# Patient Record
Sex: Female | Born: 1960 | Race: Black or African American | Hispanic: No | Marital: Married | Smoking: Former smoker
Health system: Southern US, Community
[De-identification: ages and names within clinical notes are randomized; demographics above are authoritative.]

## PROBLEM LIST (undated history)

## (undated) DIAGNOSIS — I10 Essential (primary) hypertension: Secondary | ICD-10-CM

## (undated) DIAGNOSIS — E162 Hypoglycemia, unspecified: Secondary | ICD-10-CM

## (undated) DIAGNOSIS — K219 Gastro-esophageal reflux disease without esophagitis: Secondary | ICD-10-CM

## (undated) DIAGNOSIS — E119 Type 2 diabetes mellitus without complications: Secondary | ICD-10-CM

## (undated) DIAGNOSIS — R42 Dizziness and giddiness: Secondary | ICD-10-CM

## (undated) DIAGNOSIS — F329 Major depressive disorder, single episode, unspecified: Secondary | ICD-10-CM

## (undated) DIAGNOSIS — M1990 Unspecified osteoarthritis, unspecified site: Secondary | ICD-10-CM

## (undated) DIAGNOSIS — F32A Depression, unspecified: Secondary | ICD-10-CM

## (undated) HISTORY — PX: OTHER SURGICAL HISTORY: SHX169

## (undated) HISTORY — DX: Major depressive disorder, single episode, unspecified: F32.9

## (undated) HISTORY — DX: Type 2 diabetes mellitus without complications: E11.9

## (undated) HISTORY — DX: Essential (primary) hypertension: I10

## (undated) HISTORY — PX: PARTIAL HYSTERECTOMY: SHX80

## (undated) HISTORY — DX: Dizziness and giddiness: R42

## (undated) HISTORY — DX: Depression, unspecified: F32.A

---

## 2000-08-02 ENCOUNTER — Encounter: Admission: RE | Admit: 2000-08-02 | Discharge: 2000-08-02 | Payer: Self-pay | Admitting: *Deleted

## 2000-08-02 ENCOUNTER — Encounter: Payer: Self-pay | Admitting: *Deleted

## 2009-03-19 ENCOUNTER — Ambulatory Visit: Payer: Self-pay | Admitting: Oncology

## 2009-03-26 LAB — TECHNOLOGIST REVIEW: Technologist Review: 2

## 2009-03-26 LAB — CBC WITH DIFFERENTIAL/PLATELET
BASO%: 0.5 % (ref 0.0–2.0)
Basophils Absolute: 0.1 10*3/uL (ref 0.0–0.1)
EOS%: 1.4 % (ref 0.0–7.0)
Eosinophils Absolute: 0.2 10*3/uL (ref 0.0–0.5)
HCT: 42.3 % (ref 34.8–46.6)
HGB: 14.1 g/dL (ref 11.6–15.9)
LYMPH%: 30.7 % (ref 14.0–49.7)
MCH: 29.7 pg (ref 25.1–34.0)
MCHC: 33.4 g/dL (ref 31.5–36.0)
MCV: 89.1 fL (ref 79.5–101.0)
MONO#: 0.7 10*3/uL (ref 0.1–0.9)
MONO%: 5.2 % (ref 0.0–14.0)
NEUT#: 8 10*3/uL — ABNORMAL HIGH (ref 1.5–6.5)
NEUT%: 62.2 % (ref 38.4–76.8)
Platelets: 297 10*3/uL (ref 145–400)
RBC: 4.75 10*6/uL (ref 3.70–5.45)
RDW: 13.4 % (ref 11.2–14.5)
WBC: 12.9 10*3/uL — ABNORMAL HIGH (ref 3.9–10.3)
lymph#: 4 10*3/uL — ABNORMAL HIGH (ref 0.9–3.3)

## 2009-03-26 LAB — COMPREHENSIVE METABOLIC PANEL
ALT: 22 U/L (ref 0–35)
Albumin: 4.1 g/dL (ref 3.5–5.2)
BUN: 16 mg/dL (ref 6–23)
CO2: 25 mEq/L (ref 19–32)
Calcium: 9.5 mg/dL (ref 8.4–10.5)
Creatinine, Ser: 0.82 mg/dL (ref 0.40–1.20)
Glucose, Bld: 146 mg/dL — ABNORMAL HIGH (ref 70–99)
Sodium: 138 mEq/L (ref 135–145)
Total Bilirubin: 0.2 mg/dL — ABNORMAL LOW (ref 0.3–1.2)

## 2009-06-20 ENCOUNTER — Ambulatory Visit: Payer: Self-pay | Admitting: Oncology

## 2009-06-27 LAB — CBC WITH DIFFERENTIAL/PLATELET
BASO%: 0.3 % (ref 0.0–2.0)
Basophils Absolute: 0 10*3/uL (ref 0.0–0.1)
EOS%: 1.3 % (ref 0.0–7.0)
Eosinophils Absolute: 0.2 10*3/uL (ref 0.0–0.5)
HCT: 35.8 % (ref 34.8–46.6)
HGB: 12.3 g/dL (ref 11.6–15.9)
LYMPH%: 28.5 % (ref 14.0–49.7)
MCH: 30.3 pg (ref 25.1–34.0)
MCHC: 34.3 g/dL (ref 31.5–36.0)
MCV: 88.5 fL (ref 79.5–101.0)
MONO#: 0.8 10*3/uL (ref 0.1–0.9)
MONO%: 6 % (ref 0.0–14.0)
NEUT#: 8.2 10*3/uL — ABNORMAL HIGH (ref 1.5–6.5)
NEUT%: 63.9 % (ref 38.4–76.8)
Platelets: 270 10*3/uL (ref 145–400)
RBC: 4.05 10*6/uL (ref 3.70–5.45)
RDW: 12.9 % (ref 11.2–14.5)
WBC: 12.8 10*3/uL — ABNORMAL HIGH (ref 3.9–10.3)
lymph#: 3.7 10*3/uL — ABNORMAL HIGH (ref 0.9–3.3)

## 2009-08-20 ENCOUNTER — Emergency Department: Payer: Self-pay | Admitting: Emergency Medicine

## 2009-11-17 ENCOUNTER — Encounter: Admission: RE | Admit: 2009-11-17 | Discharge: 2009-11-17 | Payer: Self-pay | Admitting: Family Medicine

## 2010-01-02 ENCOUNTER — Ambulatory Visit: Payer: Self-pay | Admitting: Oncology

## 2012-06-09 ENCOUNTER — Other Ambulatory Visit: Payer: Self-pay | Admitting: Family Medicine

## 2012-06-09 DIAGNOSIS — Z1231 Encounter for screening mammogram for malignant neoplasm of breast: Secondary | ICD-10-CM

## 2012-07-17 ENCOUNTER — Ambulatory Visit
Admission: RE | Admit: 2012-07-17 | Discharge: 2012-07-17 | Disposition: A | Discharge status: Home / Self Care | Payer: 59 | Source: Ambulatory Visit | Attending: Family Medicine | Admitting: Family Medicine

## 2012-07-17 DIAGNOSIS — Z1231 Encounter for screening mammogram for malignant neoplasm of breast: Secondary | ICD-10-CM

## 2012-08-21 ENCOUNTER — Ambulatory Visit: Payer: Self-pay | Admitting: Specialist

## 2012-09-13 ENCOUNTER — Ambulatory Visit: Payer: Self-pay | Admitting: Specialist

## 2012-10-10 ENCOUNTER — Ambulatory Visit: Payer: Self-pay | Admitting: Specialist

## 2013-01-02 ENCOUNTER — Ambulatory Visit: Payer: Self-pay | Admitting: Specialist

## 2013-01-09 ENCOUNTER — Inpatient Hospital Stay: Payer: Self-pay | Admitting: Specialist

## 2013-01-09 HISTORY — PX: LAPAROSCOPIC GASTRIC SLEEVE RESECTION: SHX5895

## 2013-01-10 LAB — BASIC METABOLIC PANEL
Calcium, Total: 8.8 mg/dL (ref 8.5–10.1)
Chloride: 107 mmol/L (ref 98–107)
Co2: 22 mmol/L (ref 21–32)
EGFR (African American): >60
EGFR (Non-African Amer.): >60
Osmolality: 270 (ref 275–301)
Potassium: 3.8 mmol/L (ref 3.5–5.1)
Sodium: 136 mmol/L (ref 136–145)

## 2013-01-10 LAB — CBC WITH DIFFERENTIAL/PLATELET
Basophil #: 0 10*3/uL (ref 0.0–0.1)
Basophil %: 0.3 %
Eosinophil #: 0 10*3/uL (ref 0.0–0.7)
Eosinophil %: 0 %
HGB: 11.9 g/dL — ABNORMAL LOW (ref 12.0–16.0)
Lymphocyte #: 2.3 10*3/uL (ref 1.0–3.6)
MCH: 28.5 pg (ref 26.0–34.0)
MCV: 86 fL (ref 80–100)
Neutrophil #: 11.2 10*3/uL — ABNORMAL HIGH (ref 1.4–6.5)
Neutrophil %: 77.1 %
Platelet: 295 10*3/uL (ref 150–440)
RDW: 13.6 % (ref 11.5–14.5)
WBC: 14.6 10*3/uL — ABNORMAL HIGH (ref 3.6–11.0)

## 2013-01-10 LAB — MAGNESIUM: Magnesium: 1.4 mg/dL — ABNORMAL LOW

## 2013-01-10 LAB — PHOSPHORUS: Phosphorus: 3.2 mg/dL (ref 2.5–4.9)

## 2013-01-10 LAB — ALBUMIN: Albumin: 3.4 g/dL (ref 3.4–5.0)

## 2013-02-01 ENCOUNTER — Ambulatory Visit: Payer: Self-pay | Admitting: Specialist

## 2013-02-10 ENCOUNTER — Ambulatory Visit: Payer: Self-pay | Admitting: Specialist

## 2013-07-16 IMAGING — US ABDOMEN ULTRASOUND LIMITED
1 series · 14 of 25 positions shown · non-contrast
Comparison: none

REASON FOR EXAM: high blood pressure diabetes snoring morbid obesity
COMMENTS:

[Series 1: abdomen ultrasound limited · 0.26mm/px · 14 of 52 slices shown]
[im 1/52]
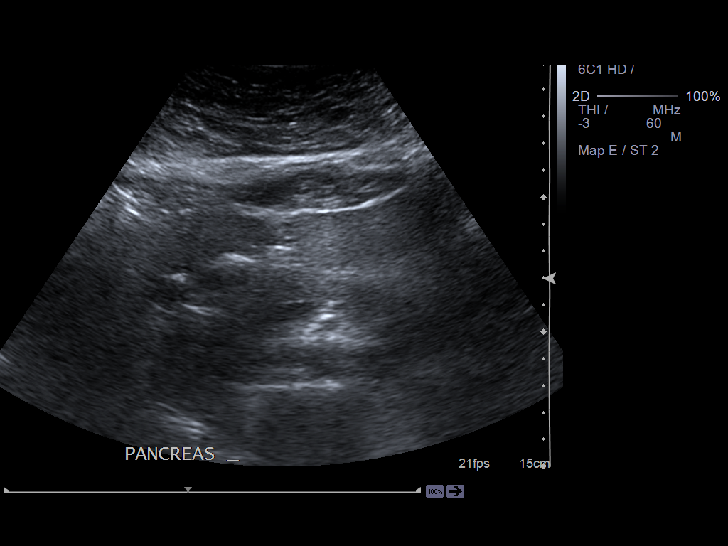
[im 5/52]
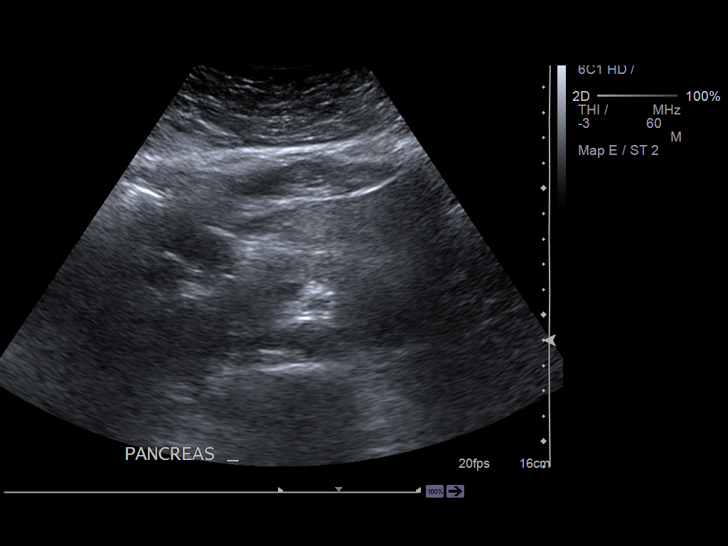
[im 9/52]
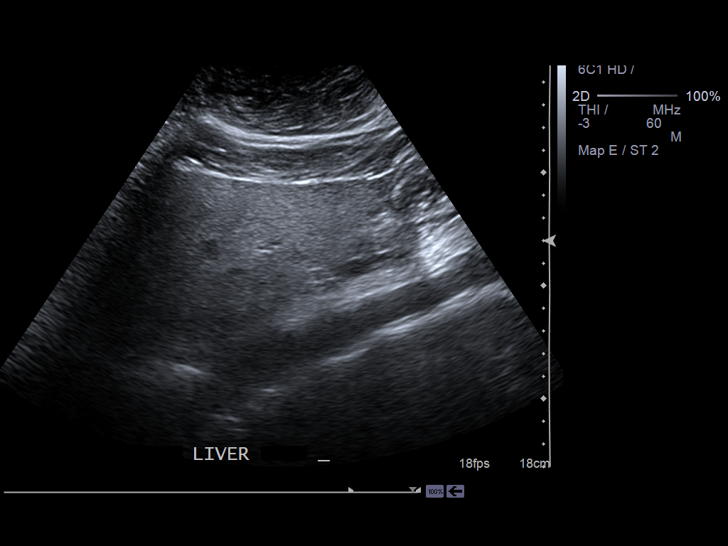
[im 13/52]
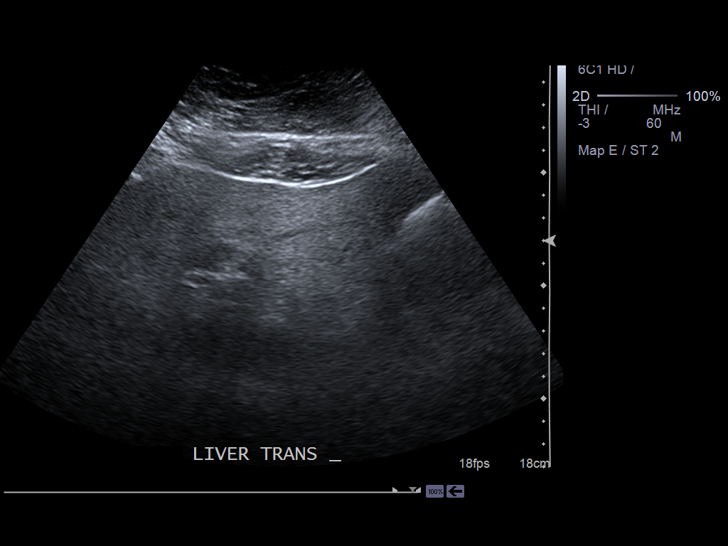
[im 18/52]
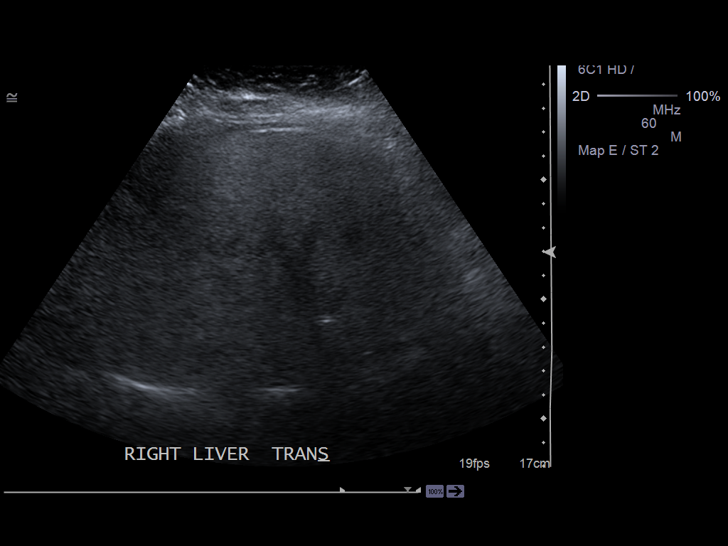
[im 20/52]
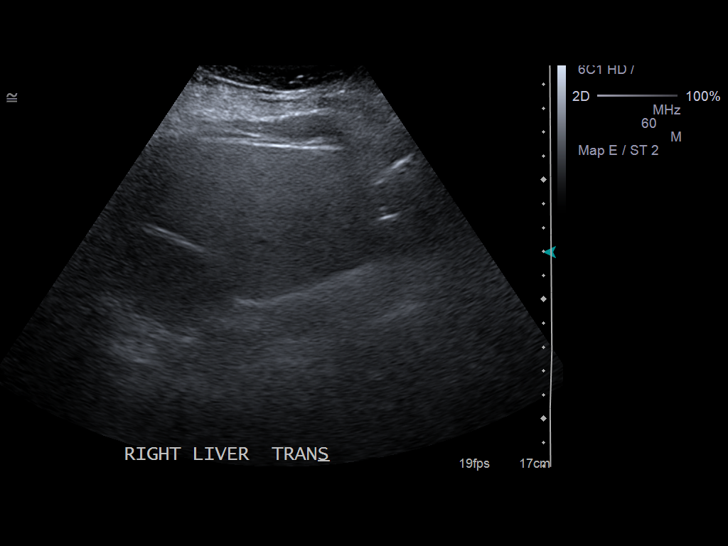
[im 24/52]
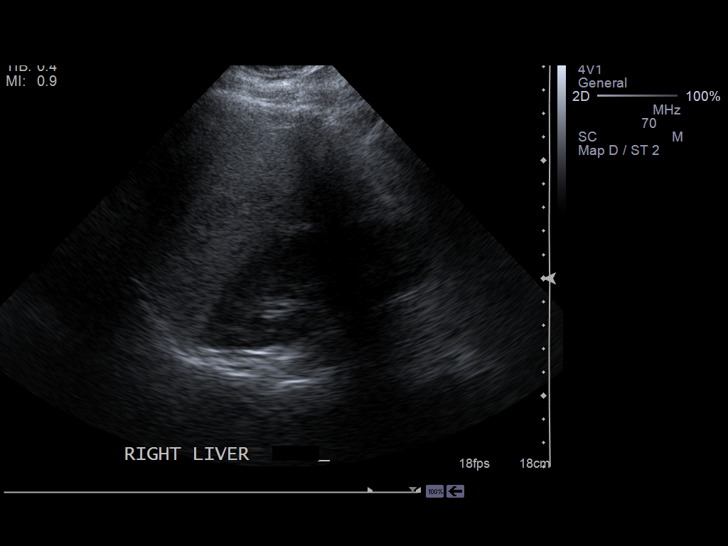
[im 28/52]
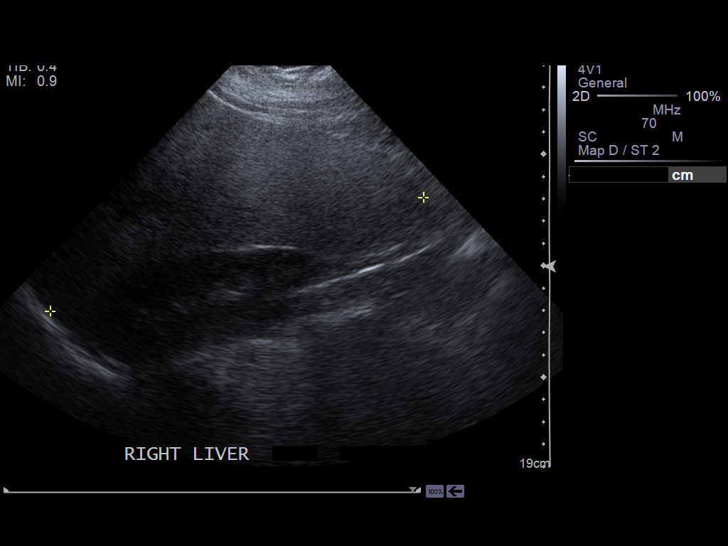
[im 32/52]
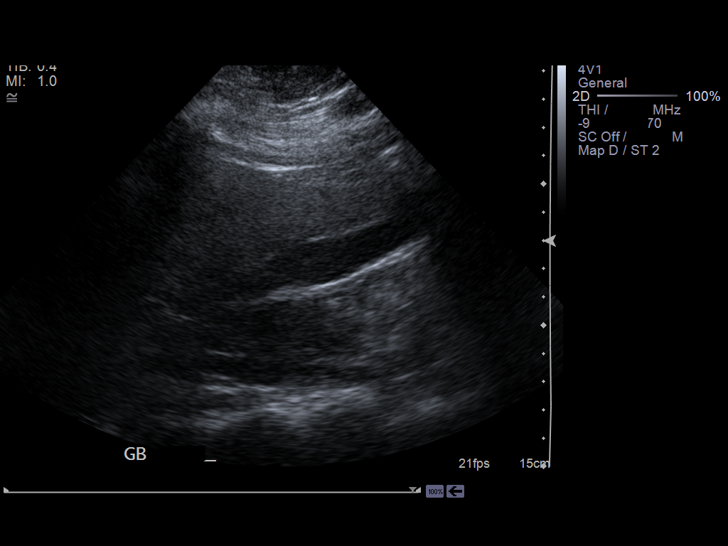
[im 35/52]
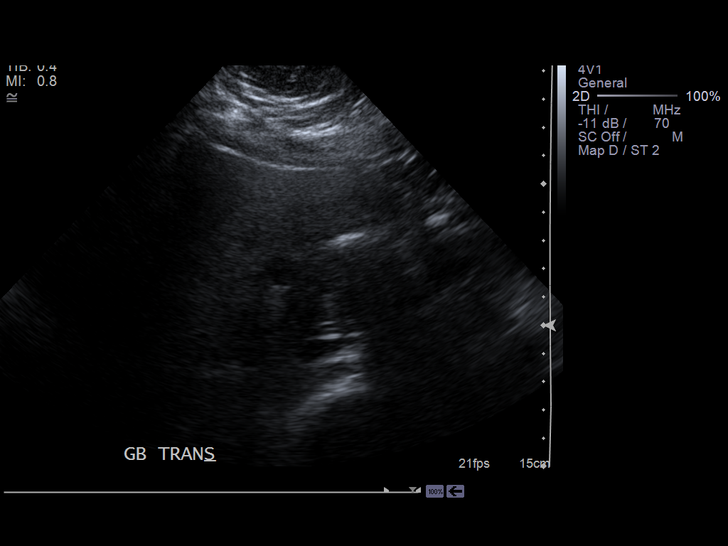
[im 39/52]
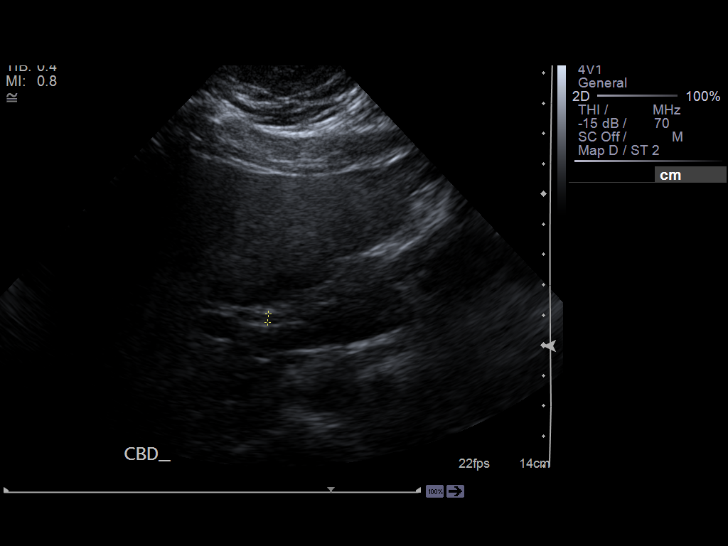
[im 43/52]
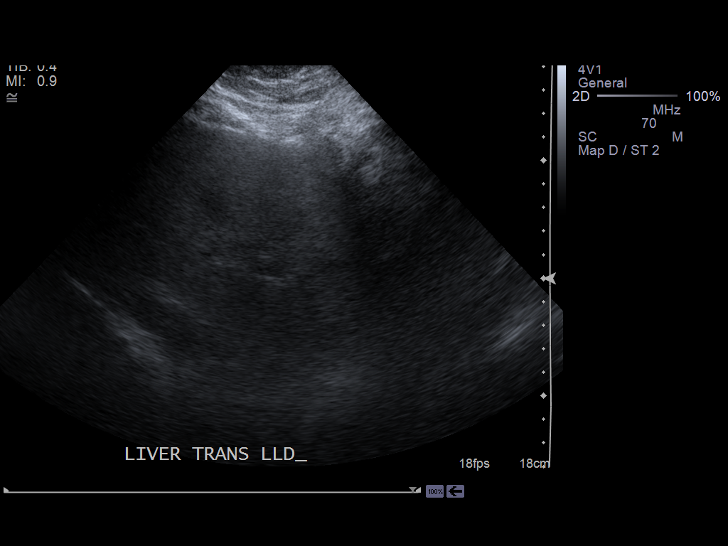
[im 47/52]
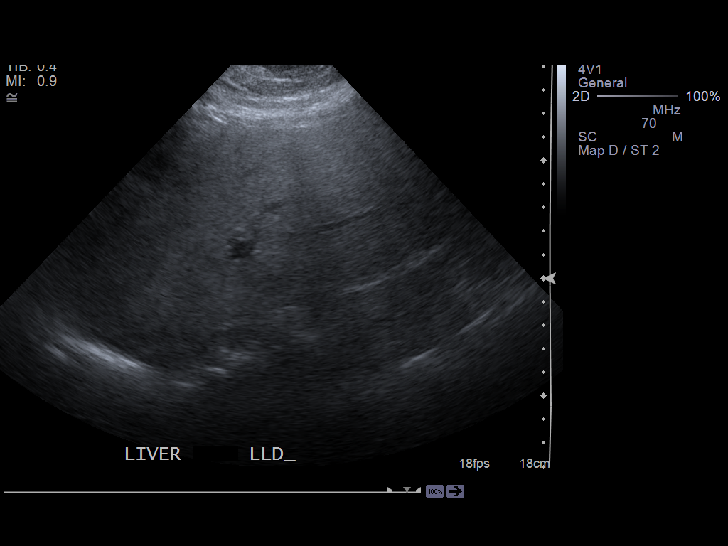
[im 52/52]
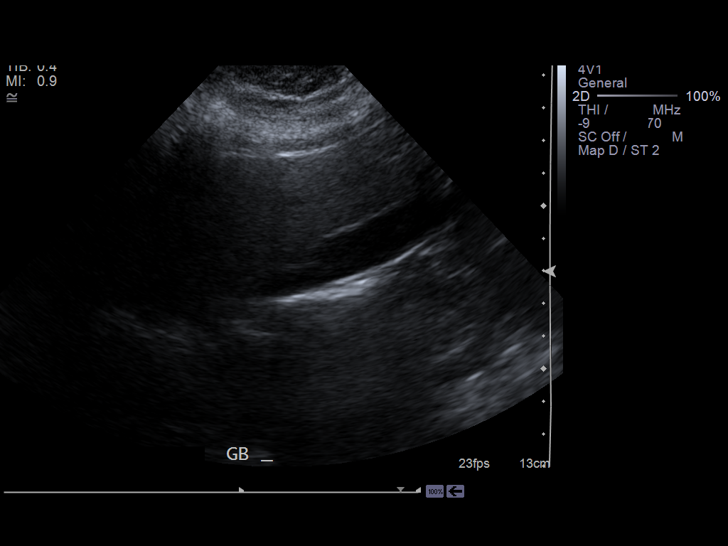

[14 of 25 positions shown; findings below may reference images not displayed]

PROCEDURE:     US  - US ABDOMEN LIMITED SURVEY  - August 21, 2012  [DATE]

RESULT:     A limited upper abdominal ultrasound was performed.

The gallbladder appears mildly contracted. There is no positive sonographic
Murphy's sign. There is no gallbladder wall thickening nor pericholecystic
fluid. The common bile duct is normal at 2.8 mm in diameter.

The liver exhibits mildly increased echotexture suggesting fatty
infiltrative change. There is no focal mass nor ductal dilation. Portal
venous flow is normal in direction toward the liver. The pancreas is normal
in contour and echotexture.
IMPRESSION: 1. The gallbladder appears mildly contracted but there is no sonographic
evidence of stones or acute cholecystitis.
2. There are likely fatty infiltrative changes of the liver.
3. The pancreas is normal in appearance.

[REDACTED]

## 2014-08-02 NOTE — Op Note (Signed)
PATIENT NAME:  Jessica Boone, Jessica Boone MR#:  [id number] DATE OF BIRTH:  [birth year]  DATE OF PROCEDURE:  01/09/2013  PREOPERATIVE DIAGNOSIS: Morbid obesity, hiatal hernia.  POSTOPERATIVE DIAGNOSIS: Morbid obesity, hiatal hernia.  PROCEDURE:  Laparoscopic sleeve gastrectomy with hiatal hernia repair.  SURGEON: Primus BravoJon Bethene Hankinson, MD  ASSISTANT:  Mariella SaaSarah Stout, PA-C  ANESTHESIA:  General endotracheal.  INDICATION:  See History and Physical.   COMPLICATIONS: None.  ESTIMATED BLOOD LOSS: None.  SPECIMEN:  Portion of stomach.  CLINICAL HISTORY: See H and P.  DETAILS OF PROCEDURE: The patient was taken to the operating room and placed on the operating room table in the supine, monitors and supplemental oxygen being delivered. Broad spectrum IV antibiotics were administered. The patient was placed under general anesthesia without incident. The abdomen was prepped and draped in the usual sterile fashion. Access was obtained using 5 mm Optical trocar in the left upper outer quadrant. Pneumoperitoneum was established. Multiple other ports were placed in preparation for sleeve gastrectomy and liver retractor placed. At that point, a moderate hiatal hernia was identified. The Harmonic scalpel was utilized to take down the pars flaccida of the right crura and the right crura was fully mobilized. The esophagus was retracted anteriorly showing the crural junction and the moderate hiatal hernia. The posterior vagus nerve was preserved throughout the entire dissection.  The posterior crural repair was performed using interrupted 0 Surgidac suture. At that point, attention was then placed to the stomach. The greater curvature vascular was mobilized from 5 to 7 cm from the pylorus all the way up to the left crura. The posterior attachments were taken down as well completely mobilizing the fundus and the greater curvature of the stomach so the crow's feet could be fully visualized all the way up. A 34 [place] Bougie was then inserted  transorally down into the distal antrum and several sequential firings starting with an echelon green load used to bisect the antrum between the lesser curvature and the greater curvature and then sequential firings of the blue load through the opposite trocar. Using the technique of Hargroder all the way up to just lateral to the GE junction. Staples were all formed very nicely. No evidence of leak was present. No evidence of bleeding was present. The specimen was retrieved and sent for pathologic evaluation. The entire operation blood loss was less than 250 mL. No complications occurred during the entire procedure. I performed upper endoscopy and a nice curvilinear tube was present without any impingement upon the linea angularis. I was quite pleased with the outcome. I used the suction and sucked out the extra gas. I then regowned and regloved and pulled out the delivery retractor as the specimen had already been retrieved. The wounds were closed with 4-0 Vicryl and Dermabond. The patient tolerated the procedure well, was taken to the recovery room in stable condition, and will be admitted for approximately two days for care.    ADDENDUM:  Hiatal hernia was closed in the following fashion: The hiatus was addressed and closed using interrupted Surgidac sutures.  ____________________________ Primus BravoJon Ashlyn Cabler, MD jb:sb D: 01/09/2013 08:52:23 ET T: 01/09/2013 09:10:52 ET JOB#: [id number]  cc: Primus BravoJon Evonna Stoltz, MD, <Dictator> Geoffry ParadiseJON M Shaday Rayborn MD ELECTRONICALLY SIGNED 01/09/2013 10:41

## 2014-11-11 ENCOUNTER — Other Ambulatory Visit: Payer: Self-pay

## 2014-11-11 DIAGNOSIS — Z1231 Encounter for screening mammogram for malignant neoplasm of breast: Secondary | ICD-10-CM

## 2014-11-22 ENCOUNTER — Ambulatory Visit
Admission: RE | Admit: 2014-11-22 | Discharge: 2014-11-22 | Disposition: A | Discharge status: Home / Self Care | Payer: 59 | Source: Ambulatory Visit

## 2014-11-22 DIAGNOSIS — Z1231 Encounter for screening mammogram for malignant neoplasm of breast: Secondary | ICD-10-CM

## 2015-06-25 ENCOUNTER — Emergency Department: Payer: 59

## 2015-06-25 ENCOUNTER — Encounter: Payer: Self-pay | Admitting: Emergency Medicine

## 2015-06-25 ENCOUNTER — Emergency Department
Admission: EM | Admit: 2015-06-25 | Discharge: 2015-06-25 | Disposition: A | Discharge status: Home / Self Care | Payer: 59 | Attending: Emergency Medicine | Admitting: Emergency Medicine

## 2015-06-25 DIAGNOSIS — R1010 Upper abdominal pain, unspecified: Secondary | ICD-10-CM | POA: Diagnosis not present

## 2015-06-25 DIAGNOSIS — R079 Chest pain, unspecified: Secondary | ICD-10-CM | POA: Insufficient documentation

## 2015-06-25 DIAGNOSIS — I10 Essential (primary) hypertension: Secondary | ICD-10-CM | POA: Insufficient documentation

## 2015-06-25 DIAGNOSIS — R109 Unspecified abdominal pain: Secondary | ICD-10-CM

## 2015-06-25 DIAGNOSIS — F17210 Nicotine dependence, cigarettes, uncomplicated: Secondary | ICD-10-CM | POA: Diagnosis not present

## 2015-06-25 DIAGNOSIS — E119 Type 2 diabetes mellitus without complications: Secondary | ICD-10-CM | POA: Insufficient documentation

## 2015-06-25 LAB — CBC WITH DIFFERENTIAL/PLATELET
BASOS ABS: 0 10*3/uL (ref 0–0.1)
BASOS PCT: 0 %
EOS PCT: 1 %
Eosinophils Absolute: 0.1 10*3/uL (ref 0–0.7)
HCT: 39.5 % (ref 35.0–47.0)
Hemoglobin: 13 g/dL (ref 12.0–16.0)
LYMPHS PCT: 27 %
Lymphs Abs: 2.4 10*3/uL (ref 1.0–3.6)
MCH: 28.3 pg (ref 26.0–34.0)
MCHC: 33 g/dL (ref 32.0–36.0)
MCV: 85.8 fL (ref 80.0–100.0)
MONO ABS: 0.8 10*3/uL (ref 0.2–0.9)
Monocytes Relative: 8 %
NEUTROS ABS: 5.7 10*3/uL (ref 1.4–6.5)
Neutrophils Relative %: 64 %
PLATELETS: 266 10*3/uL (ref 150–440)
RBC: 4.6 MIL/uL (ref 3.80–5.20)
RDW: 13.6 % (ref 11.5–14.5)
WBC: 8.9 10*3/uL (ref 3.6–11.0)

## 2015-06-25 LAB — LIPASE, BLOOD: Lipase: 37 U/L (ref 11–51)

## 2015-06-25 LAB — COMPREHENSIVE METABOLIC PANEL
ALT: 108 U/L — ABNORMAL HIGH (ref 14–54)
ANION GAP: 7 (ref 5–15)
AST: 263 U/L — ABNORMAL HIGH (ref 15–41)
Albumin: 3.9 g/dL (ref 3.5–5.0)
Alkaline Phosphatase: 100 U/L (ref 38–126)
BUN: 26 mg/dL — ABNORMAL HIGH (ref 6–20)
CALCIUM: 9.5 mg/dL (ref 8.9–10.3)
CHLORIDE: 102 mmol/L (ref 101–111)
CO2: 28 mmol/L (ref 22–32)
Creatinine, Ser: 0.78 mg/dL (ref 0.44–1.00)
Glucose, Bld: 132 mg/dL — ABNORMAL HIGH (ref 65–99)
Potassium: 4 mmol/L (ref 3.5–5.1)
SODIUM: 137 mmol/L (ref 135–145)
Total Bilirubin: 0.5 mg/dL (ref 0.3–1.2)
Total Protein: 7.5 g/dL (ref 6.5–8.1)

## 2015-06-25 LAB — TROPONIN I: Troponin I: <0.03 ng/mL

## 2015-06-25 MED ORDER — IOHEXOL 350 MG/ML SOLN
100.0000 mL | Freq: Once | INTRAVENOUS | Status: AC | PRN
  Administered 2015-06-25: 100 mL via INTRAVENOUS

## 2015-06-25 MED ORDER — HYDROMORPHONE HCL 1 MG/ML IJ SOLN
0.5000 mg | Freq: Once | INTRAMUSCULAR | Status: AC
  Administered 2015-06-25: 0.5 mg via INTRAVENOUS
  Filled 2015-06-25: qty 1

## 2015-06-25 MED ORDER — ESOMEPRAZOLE MAGNESIUM 40 MG PO CPDR: 40.0000 mg | DELAYED_RELEASE_CAPSULE | Freq: Every day | ORAL | Status: DC

## 2015-06-25 MED ORDER — IOHEXOL 240 MG/ML SOLN
25.0000 mL | Freq: Once | INTRAMUSCULAR | Status: DC | PRN
  Administered 2015-06-25: 25 mL via ORAL

## 2015-06-25 MED ORDER — DIPHENHYDRAMINE HCL 50 MG/ML IJ SOLN
25.0000 mg | Freq: Once | INTRAMUSCULAR | Status: AC
  Administered 2015-06-25: 25 mg via INTRAVENOUS
  Filled 2015-06-25: qty 1

## 2015-06-25 MED ORDER — GI COCKTAIL ~~LOC~~
ORAL | Status: AC
  Administered 2015-06-25: 30 mL via ORAL
  Filled 2015-06-25: qty 30

## 2015-06-25 MED ORDER — GI COCKTAIL ~~LOC~~
30.0000 mL | Freq: Once | ORAL | Status: AC
  Administered 2015-06-25: 30 mL via ORAL

## 2015-06-25 MED ORDER — ONDANSETRON HCL 4 MG/2ML IJ SOLN
4.0000 mg | Freq: Once | INTRAMUSCULAR | Status: AC
  Administered 2015-06-25: 4 mg via INTRAVENOUS
  Filled 2015-06-25: qty 2

## 2015-06-25 NOTE — Discharge Instructions (Signed)
Abdominal Pain, Adult Many things can cause abdominal pain. Usually, abdominal pain is not caused by a disease and will improve without treatment. It can often be observed and treated at home. Your health care provider will do a physical exam and possibly order blood tests and X-rays to help determine the seriousness of your pain. However, in many cases, more time must pass before a clear cause of the pain can be found. Before that point, your health care provider may not know if you need more testing or further treatment. HOME CARE INSTRUCTIONS Monitor your abdominal pain for any changes. The following actions may help to alleviate any discomfort you are experiencing:  Only take over-the-counter or prescription medicines as directed by your health care provider.  Do not take laxatives unless directed to do so by your health care provider.  Try a clear liquid diet (broth, tea, or water) as directed by your health care provider. Slowly move to a bland diet as tolerated. SEEK MEDICAL CARE IF:  You have unexplained abdominal pain.  You have abdominal pain associated with nausea or diarrhea.  You have pain when you urinate or have a bowel movement.  You experience abdominal pain that wakes you in the night.  You have abdominal pain that is worsened or improved by eating food.  You have abdominal pain that is worsened with eating fatty foods.  You have a fever. SEEK IMMEDIATE MEDICAL CARE IF:  Your pain does not go away within 2 hours.  You keep throwing up (vomiting).  Your pain is felt only in portions of the abdomen, such as the right side or the left lower portion of the abdomen.  You pass bloody or black tarry stools. MAKE SURE YOU:  Understand these instructions.  Will watch your condition.  Will get help right away if you are not doing well or get worse.   This information is not intended to replace advice given to you by your health care provider. Make sure you discuss  any questions you have with your health care provider.   Document Released: 01/06/2005 Document Revised: 12/18/2014 Document Reviewed: 12/06/2012 Elsevier Interactive Patient Education [employer]2016 [employer]vier Inc.  I think that your chest pain and abdominal pain are related to either acid reflux or gastritis or something similar. I will give you an acid blocker to take. Please follow-up with your regular doctor. Also please call the cardiologist arrange follow-up just to make sure there is nothing going on with her heart although I do not believe that  that is a problem.

## 2015-06-25 NOTE — ED Provider Notes (Signed)
[facility] Emergency Department Provider Note  ____________________________________________  Time seen: Approximately 12:14 PM  I have reviewed the triage vital signs and the nursing notes.   HISTORY  Chief Complaint Abdominal Pain and Chest Pain   HPI Rozena Fierro [facility] is a 55 y.o. female who reports pain in the lower chest and upper abdomen starting about 10:30 this morning. Pain is severe in nature it's deep and achy makes her short of breath and nauseated and sweating. The pain was not helped by Alka-Seltzer. And really doesn't get better if she rests but gets worse if she gets up and walks around. She does not remember having anything like this before. Exit might be a bad gas pain but again Alka-Seltzer didn't help it.   Past Medical History  Diagnosis Date  . Hypertension   . Diabetes mellitus without complication (HCC)     There are no active problems to display for this patient.   Past Surgical History  Procedure Laterality Date  . Cesera    . Cesarean section    . Partial hysterectomy    . Laparoscopic gastric sleeve resection      Current Outpatient Rx  Name  Route  Sig  Dispense  Refill  . b complex vitamins capsule   Oral   Take 1 capsule by mouth daily.         Marland Kitchen esomeprazole (NEXIUM) 40 MG capsule   Oral   Take 1 capsule (40 mg total) by mouth daily.   30 capsule   1     Allergies Trazodone and nefazodone  No family history on file.  Social History Social History  Substance Use Topics  . Smoking status: Current Every Day Smoker  . Smokeless tobacco: None  . Alcohol Use: No    Review of Systems Constitutional: No fever/chills Eyes: No visual changes. ENT: No sore throat. Cardiovascular: See history of present illness Respiratory: See history of present illness Gastrointestinal see history of present illness Genitourinary: Negative for dysuria. Musculoskeletal: Negative for back pain. Skin: Negative for  rash. Neurological: Negative for headaches, focal weakness or numbness.  10-point ROS otherwise negative.  ____________________________________________   PHYSICAL EXAM:  VITAL SIGNS: ED Triage Vitals  Enc Vitals Group     BP 06/25/15 1150 186/98 mmHg     Pulse Rate 06/25/15 1150 87     Resp 06/25/15 1150 25     Temp 06/25/15 1150 97.4 F (36.3 C)     Temp Source 06/25/15 1150 Oral     SpO2 06/25/15 1150 100 %     Weight 06/25/15 1150 155 lb (70.308 kg)     Height 06/25/15 1150  (1.575 m)     Head Cir --      Peak Flow --      Pain Score 06/25/15 1151 7     Pain Loc --      Pain Edu? --      Excl. in GC? --     Constitutional: Alert and oriented. Looks uncomfortable in the bed Eyes: Conjunctivae are normal. PERRL. EOMI. Head: Atraumatic. Nose: No congestion/rhinnorhea. Mouth/Throat: Mucous membranes are moist.  Oropharynx non-erythematous. Neck: No stridor.  Cardiovascular: Normal rate, regular rhythm. Grossly normal heart sounds.  Good peripheral circulation. Respiratory: Normal respiratory effort.  No retractions. Lungs CTAB. Gastrointestinal: Soft and somewhat tender in the epigastric area but patient reports this is not the same pain she is having. No distention. No abdominal bruits. No CVA tenderness. Musculoskeletal: No lower  extremity tenderness nor edema.  No joint effusions. Neurologic:  Normal speech and language. No gross focal neurologic deficits are appreciated. No gait instability. Skin:  Skin is warm, dry and intact. No rash noted. Psychiatric: Mood and affect are normal. Speech and behavior are normal.  ____________________________________________   LABS (all labs ordered are listed, but only abnormal results are displayed)  Labs Reviewed  COMPREHENSIVE METABOLIC PANEL - Abnormal; Notable for the following:    Glucose, Bld 132 (*)    BUN 26 (*)    AST 263 (*)    ALT 108 (*)    All other components within normal limits  LIPASE, BLOOD   TROPONIN I  CBC WITH DIFFERENTIAL/PLATELET  TROPONIN I   ____________________________________________  EKG  EKG read and interpreted by me shows normal sinus rhythm rate of 85 normal axis essentially normal EKG ____________________________________________  RADIOLOGY  CT of the chest shows no pulmonary emboli there are some nodules present CT the radiologist recommends re-CT in 3 months. CT of the abdomen shows no acute pathology ____________________________________________   PROCEDURES  Patient reports pain went away with the GI cocktail.  ____________________________________________   INITIAL IMPRESSION / ASSESSMENT AND PLAN / ED COURSE  Pertinent labs & imaging results that were available during my care of the patient were reviewed by me and considered in my medical decision making (see chart for details).  \ ____________________________________________   FINAL CLINICAL IMPRESSION(S) / ED DIAGNOSES  Final diagnoses:  Chest pain, unspecified chest pain type  Abdominal pain, unspecified abdominal location      Arnaldo NatalPaul F Eros Montour, MD 06/25/15 2157

## 2015-06-25 NOTE — ED Notes (Signed)
Pt comes into the ED via EMS c/o upper epifastric pain that radiates into her chest.  Pain started this morning and patient took antacids with no relief.  Patient present belching and with tightness in her chest.  States she has had nausea and shortness of breath.  Initial vitals include:  203/104, CBG 164, NSR, 100 HR, 100 room air.

## 2015-08-13 ENCOUNTER — Other Ambulatory Visit: Payer: Self-pay | Admitting: Physician Assistant

## 2015-08-13 DIAGNOSIS — R112 Nausea with vomiting, unspecified: Secondary | ICD-10-CM

## 2015-08-13 DIAGNOSIS — R1013 Epigastric pain: Secondary | ICD-10-CM

## 2015-08-13 DIAGNOSIS — R1011 Right upper quadrant pain: Secondary | ICD-10-CM

## 2015-08-25 ENCOUNTER — Ambulatory Visit
Admission: RE | Admit: 2015-08-25 | Discharge: 2015-08-25 | Disposition: A | Discharge status: Home / Self Care | Payer: 59 | Source: Ambulatory Visit | Attending: Physician Assistant | Admitting: Physician Assistant

## 2015-08-25 DIAGNOSIS — R1011 Right upper quadrant pain: Secondary | ICD-10-CM

## 2015-08-25 DIAGNOSIS — R1013 Epigastric pain: Secondary | ICD-10-CM

## 2015-08-25 DIAGNOSIS — R112 Nausea with vomiting, unspecified: Secondary | ICD-10-CM

## 2015-09-02 ENCOUNTER — Encounter: Payer: Self-pay | Admitting: Cardiology

## 2015-09-04 ENCOUNTER — Ambulatory Visit: Payer: 59 | Admitting: Cardiology

## 2015-09-10 ENCOUNTER — Ambulatory Visit: Payer: Self-pay | Admitting: Surgery

## 2015-09-10 NOTE — H&P (Signed)
Jessica Boone [facility] 09/10/2015 9:21 AM Location: [facility] Patient #: [id number] DOB: 09/06/1960 Married / Language: English / Race: Black or African American Female  History of Present Illness Jessica Boone Elmore MD; 09/10/2015 10:31 AM) Patient words: abdominal pain/GB     Patient sent at the request of Dr. Dulce Sellar for abdominal pain. She has a history of a gastric sleeve procedure in 2014. She lost 80 pounds or so but is gaining some of that back. She has had intermittent epigastric and chest pain after eating. The pain is sharp in nature locations epigastrium and right upper quadrant associated with nausea and indigestion. The pain comes and goes. There is no significant relationship to food she states. She has 3-4 tacks a month which last a few minutes to 1 hour or so. Her last one ended up taking her to the emergency room. Ultrasound shows multiple gallstones without gallbladder wall thickening nor dilation of the common duct. Today she is asymptomatic.  The patient is a 55 year old female.   Other Problems [name], CMA; 09/10/2015 9:22 AM) Arthritis Back Pain Chronic Obstructive Lung Disease Depression Diabetes Mellitus Diverticulosis Gastroesophageal Reflux Disease High blood pressure Hypercholesterolemia Other disease, cancer, significant illness  Past Surgical History [name], CMA; 09/10/2015 9:22 AM) Cesarean Section - 1 Hysterectomy (not due to cancer) - Partial Resection of Stomach Sleeve Gastrectomy  Diagnostic Studies History [name], CMA; 09/10/2015 9:22 AM) Colonoscopy within last year Mammogram 1-3 years ago Pap Smear 1-5 years ago  Allergies [name], CMA; 09/10/2015 9:22 AM) TraZODone HCl *ANTIDEPRESSANTS*  Medication History [name], CMA; 09/10/2015 9:23 AM) Esomeprazole Magnesium (  Capsule DR, Oral) Active. Medications Reconciled  Social History [name], [place]; 09/10/2015 9:22 AM) Alcohol use Occasional alcohol use. Caffeine use Coffee, Tea. No drug use Tobacco use Former smoker.  Family History [name], [place]; 09/10/2015 9:22 AM) Alcohol Abuse Father. Arthritis Brother, Mother. Hypertension Daughter, Mother. Malignant Neoplasm Of Pancreas Father. Thyroid problems Mother.  Pregnancy / Birth History [name], [place]; 09/10/2015 9:22 AM) Age at menarche 9 years. Age of menopause <45 Gravida 4 Irregular periods Maternal age 66-20 Para 2     Review of Systems [name] CMA; 09/10/2015 9:22 AM) General Present- Chills and Weight Gain. Not Present- Appetite Loss, Fatigue, Fever, Night Sweats and Weight Loss. Skin Present- Dryness and Non-Healing Wounds. Not Present- Change in Wart/Mole, Hives, Jaundice, New Lesions, Rash and Ulcer. HEENT Present- Seasonal Allergies, Sinus Pain and Wears glasses/contact lenses. Not Present- Earache, Hearing Loss, Hoarseness, Nose Bleed, Oral Ulcers, Ringing in the Ears, Sore Throat, Visual Disturbances and Yellow Eyes. Breast Not Present- Breast Mass, Breast Pain, Nipple Discharge and Skin Changes. Cardiovascular Present- Swelling of Extremities. Not Present- Chest Pain, Difficulty Breathing Lying Down, Leg Cramps, Palpitations, Rapid Heart Rate and Shortness of Breath. Gastrointestinal Present- Abdominal Pain, Bloating, Change in Bowel Habits, Constipation, Excessive gas and Gets full quickly at meals. Not Present- Bloody Stool, Chronic diarrhea, Difficulty Swallowing, Hemorrhoids, Indigestion, Nausea, Rectal Pain and Vomiting. Female Genitourinary Not Present- Frequency, Nocturia, Painful Urination, Pelvic Pain and Urgency. Musculoskeletal Present- Joint Pain, Muscle Weakness and Swelling of Extremities. Not Present- Back Pain, Joint Stiffness and Muscle Pain. Neurological Present- Numbness and Tingling. Not Present- Decreased Memory, Fainting, Headaches,  Seizures, Tremor, Trouble walking and Weakness. Psychiatric Present- Change in Sleep Pattern. Not Present- Anxiety, Bipolar, Depression, Fearful and Frequent crying. Endocrine Present- Cold Intolerance, Hair Changes, Hot flashes and New Diabetes. Not  Present- Excessive Hunger and Heat Intolerance. Hematology Not Present- Easy Bruising, Excessive bleeding, Gland problems, HIV and Persistent Infections.  Vitals [employer](Michelle R. Brooks CMA; 09/10/2015 9:21 AM) 09/10/2015 9:21 AM Weight: 181.5 lb Height: 62in Body Surface Area: 1.83 m Body Mass Index: 33.2 kg/m  BP: 116/78 (Sitting, Left Arm, Standard)      Physical Exam (Ivanna Kocak A. Halayna Blane MD; 09/10/2015 10:31 AM)  General Mental Status-Alert. General Appearance-Consistent with stated age. Hydration-Well hydrated. Voice-Normal.  Head and Neck Head-normocephalic, atraumatic with no lesions or palpable masses.  Eye Eyeball - Bilateral-Extraocular movements intact. Sclera/Conjunctiva - Bilateral-No scleral icterus.  Chest and Lung Exam Chest and lung exam reveals -quiet, even and easy respiratory effort with no use of accessory muscles and on auscultation, normal breath sounds, no adventitious sounds and normal vocal resonance. Inspection Chest Wall - Normal. Back - normal.  Cardiovascular Cardiovascular examination reveals -on palpation PMI is normal in location and amplitude, no palpable S3 or S4. Normal cardiac borders., normal heart sounds, regular rate and rhythm with no murmurs, carotid auscultation reveals no bruits and normal pedal pulses bilaterally.  Abdomen Inspection Inspection of the abdomen reveals - No Hernias. Skin - Scar - no surgical scars. Palpation/Percussion Palpation and Percussion of the abdomen reveal - Soft, Non Tender, No Rebound tenderness, No Rigidity (guarding) and No hepatosplenomegaly. Auscultation Auscultation of the abdomen reveals - Bowel sounds  normal.  Neurologic Neurologic evaluation reveals -alert and oriented x 3 with no impairment of recent or remote memory. Mental Status-Normal.  Musculoskeletal Normal Exam - Left-Upper Extremity Strength Normal and Lower Extremity Strength Normal. Normal Exam - Right-Upper Extremity Strength Normal, Lower Extremity Weakness.    Assessment & Plan (Alleen Kehm A. Gizelle Whetsel MD; 09/10/2015 10:32 AM)  SYMPTOMATIC CHOLELITHIASIS (K80.20) Impression: Discussed options of laparoscopic cholecystectomy with cholangiogram versus medical management. Discussed diet modification. Risks, benefits and long-term expectations of each discussed. Patient is opted for laparoscopic cholecystectomy. The procedure has been discussed with the patient. Risks of laparoscopic cholecystectomy include bleeding, infection, bile duct injury, leak, death, open surgery, diarrhea, other surgery, organ injury, blood vessel injury, DVT, and additional care.  Current Plans Pt Education - Pamphlet Given - Laparoscopic Gallbladder Surgery: discussed with patient and provided information. The anatomy & physiology of hepatobiliary & pancreatic function was discussed. The pathophysiology of gallbladder dysfunction was discussed. Natural history risks without surgery was discussed. I feel the risks of no intervention will lead to serious problems that outweigh the operative risks; therefore, I recommended cholecystectomy to remove the pathology. I explained laparoscopic techniques with possible need for an open approach. Probable cholangiogram to evaluate the bilary tract was explained as well.  Risks such as bleeding, infection, abscess, leak, injury to other organs, need for further treatment, heart attack, death, and other risks were discussed. I noted a good likelihood this will help address the problem. Possibility that this will not correct all abdominal symptoms was explained. Goals of post-operative recovery were discussed as  well. We will work to minimize complications. An educational handout further explaining the pathology and treatment options was given as well. Questions were answered. The patient expresses understanding & wishes to proceed with surgery.  Pt Education - [facility] Laparosopic Post Op HCI (Gross) Pt Education - Laparoscopic Cholecystectomy: gallbladder

## 2015-10-22 ENCOUNTER — Other Ambulatory Visit (HOSPITAL_COMMUNITY): Payer: Self-pay | Admitting: *Deleted

## 2015-10-22 NOTE — Pre-Procedure Instructions (Signed)
[facility]  10/22/2015      [facility] - [place] ([place]), [place] - [address] [place] [address] Phone: [phone] Fax: [phone]    Your procedure is scheduled on Tuesday, October 28, 2015 at 10:50 AM.  [facility] En[facility] "A" Admitting Office at 8:50 AM.  Call this number if you have problems the morning of surgery: [phone]  Any questions prior to day of surgery, please call [phone] between 8 & 4 PM.   Remember:  Do not eat food or drink liquids after midnight Monday, 10/27/15.  Take these medicines the morning of surgery with A SIP OF WATER: Esomeprazole (Nexium), Flonase, Tylenol - if needed   Do not wear jewelry, make-up or nail polish.  Do not wear lotions, powders, or perfumes.  You may wear deoderant.  Do not shave 48 hours prior to surgery.    Do not bring valuables to the hospital.  [facility] is [facility] responsible for any belongings or valuables.  Contacts, dentures or bridgework may not be worn into surgery.  Leave your suitcase in the car.  After surgery it may be brought to your room.  For patients admitted to the hospital, discharge time will be determined by your treatment team.  Patients discharged the day of surgery will not be allowed to drive home.   Special instructions:  [place] - Preparing for Surgery  Before surgery, you can play an important role.  Because skin is not sterile, your skin needs to be as free of germs as possible.  You can reduce the number of germs on you skin by washing with CHG (chlorahexidine gluconate) soap before surgery.  CHG is an antiseptic cleaner which kills germs and bonds with the skin to continue killing germs even after washing.  Please DO NOT use if you have an allergy to CHG or antibacterial soaps.  If your skin becomes reddened/irritated stop using the CHG and inform your nurse when you arrive at Short Stay.  Do not shave (including legs and  underarms) for at least 48 hours prior to the first CHG shower.  You may shave your face.  Please follow these instructions carefully:   1.  Shower with CHG Soap the night before surgery and the                                morning of Surgery.  2.  If you choose to wash your hair, wash your hair first as usual with your       normal shampoo.  3.  After you shampoo, rinse your hair and body thoroughly to remove the                      Shampoo.  4.  Use CHG as you would any other liquid soap.  You can apply chg directly       to the skin and wash gently with scrungie or a clean washcloth.  5.  Apply the CHG Soap to your body ONLY FROM THE NECK DOWN.        Do not use on open wounds or open sores.  Avoid contact with your eyes, ears, mouth and genitals (private parts).  Wash genitals (private parts) with your normal soap.  6.  Wash thoroughly, paying special attention to the area where your surgery  will be performed.  7.  Thoroughly rinse your body with warm water from the neck down.  8.  DO NOT shower/wash with your normal soap after using and rinsing off       the CHG Soap.  9.  Pat yourself dry with a clean towel.            10.  Wear clean pajamas.            11.  Place clean sheets on your bed the night of your first shower and do not        sleep with pets.  Day of Surgery  Do not apply any lotions the morning of surgery.  Please wear clean clothes to the hospital.   Please read over the following fact sheets that you were given. Pain Booklet, Coughing and Deep Breathing and Surgical Site Infection Prevention

## 2015-10-23 ENCOUNTER — Encounter (HOSPITAL_COMMUNITY): Payer: Self-pay

## 2015-10-23 ENCOUNTER — Encounter (HOSPITAL_COMMUNITY)
Admission: RE | Admit: 2015-10-23 | Discharge: 2015-10-23 | Disposition: A | Discharge status: Home / Self Care | Payer: 59 | Source: Ambulatory Visit | Attending: Surgery | Admitting: Surgery

## 2015-10-23 DIAGNOSIS — Z01812 Encounter for preprocedural laboratory examination: Secondary | ICD-10-CM | POA: Diagnosis not present

## 2015-10-23 HISTORY — DX: Gastro-esophageal reflux disease without esophagitis: K21.9

## 2015-10-23 HISTORY — DX: Unspecified osteoarthritis, unspecified site: M19.90

## 2015-10-23 LAB — HEMOGLOBIN A1C
Hgb A1c MFr Bld: 5.8 % — ABNORMAL HIGH (ref 4.8–5.6)
Mean Plasma Glucose: 120 mg/dL

## 2015-10-23 LAB — COMPREHENSIVE METABOLIC PANEL
ALK PHOS: 93 U/L (ref 38–126)
ALT: 44 U/L (ref 14–54)
ANION GAP: 8 (ref 5–15)
AST: 27 U/L (ref 15–41)
Albumin: 3.6 g/dL (ref 3.5–5.0)
BILIRUBIN TOTAL: 0.3 mg/dL (ref 0.3–1.2)
BUN: 18 mg/dL (ref 6–20)
CALCIUM: 9 mg/dL (ref 8.9–10.3)
CO2: 25 mmol/L (ref 22–32)
Chloride: 106 mmol/L (ref 101–111)
Creatinine, Ser: 0.8 mg/dL (ref 0.44–1.00)
Glucose, Bld: 97 mg/dL (ref 65–99)
Potassium: 3.8 mmol/L (ref 3.5–5.1)
Sodium: 139 mmol/L (ref 135–145)
TOTAL PROTEIN: 7.3 g/dL (ref 6.5–8.1)

## 2015-10-23 LAB — CBC WITH DIFFERENTIAL/PLATELET
Basophils Absolute: 0 10*3/uL (ref 0.0–0.1)
Basophils Relative: 0 %
Eosinophils Absolute: 0.1 10*3/uL (ref 0.0–0.7)
Eosinophils Relative: 1 %
HEMATOCRIT: 39.6 % (ref 36.0–46.0)
HEMOGLOBIN: 12.5 g/dL (ref 12.0–15.0)
LYMPHS ABS: 3.9 10*3/uL (ref 0.7–4.0)
Lymphocytes Relative: 38 %
MCH: 26.9 pg (ref 26.0–34.0)
MCHC: 31.6 g/dL (ref 30.0–36.0)
MCV: 85.2 fL (ref 78.0–100.0)
MONO ABS: 0.7 10*3/uL (ref 0.1–1.0)
Monocytes Relative: 7 %
NEUTROS ABS: 5.5 10*3/uL (ref 1.7–7.7)
NEUTROS PCT: 54 %
Platelets: 311 10*3/uL (ref 150–400)
RBC: 4.65 MIL/uL (ref 3.87–5.11)
RDW: 13.7 % (ref 11.5–15.5)
WBC: 10.2 10*3/uL (ref 4.0–10.5)

## 2015-10-23 LAB — GLUCOSE, CAPILLARY: GLUCOSE-CAPILLARY: 88 mg/dL (ref 65–99)

## 2015-10-23 NOTE — Pre-Procedure Instructions (Signed)
[facility]  10/[facility]/2015      [facility] - [place] ([place]), [place] - [address] [place] [address] Phone: [phone] Fax: [phone]    Your procedure is scheduled on Tuesday, October 28, 2015 at 10:50 AM.  [facility] Entr[facility] "A" Admitting Office at 8:50 AM.  Call this number if you have problems the morning of surgery: [phone]  Any questions prior to day of surgery, please call [phone] between 8 & 4 PM.   Remember:  Do not eat food or drink liquids after midnight Monday, 10/27/15.  Take these medicines the morning of surgery with A SIP OF WATER: Esomeprazole (Nexium), Flonase, Tylenol - if needed   Do not wear jewelry, make-up or nail polish.  Do not wear lotions, powders, or perfumes.  You may wear deoderant.  Do not shave 48 hours prior to surgery.    Do not bring valuables to the hospital.  [facility] is not responsible for any belongings or valuables.  Contacts, dentures or bridgework may not be worn into surgery.  Leave your suitcase in the car.  After surgery it may be brought to your room.  For patients admitted to the hospital, discharge time will be determined by your treatment team.  Patients discharged the day of surgery will not be allowed to drive home.   Special instructions:  [place] - Preparing for Surgery  Before surgery, you can play an important role.  Because skin is not sterile, your skin needs to be as free of germs as possible.  You can reduce the number of germs on you skin by washing with CHG (chlorahexidine gluconate) soap before surgery.  CHG is an antiseptic cleaner which kills germs and bonds with the skin to continue killing germs even after washing.  Please DO NOT use if you have an allergy to CHG or antibacterial soaps.  If your skin becomes reddened/irritated stop using the CHG and inform your nurse when you arrive at Short Stay.  Do not shave (including legs and  underarms) for at least 48 hours prior to the first CHG shower.  You may shave your face.  Please follow these instructions carefully:   1.  Shower with CHG Soap the night before surgery and the                                morning of Surgery.  2.  If you choose to wash your hair, wash your hair first as usual with your       normal shampoo.  3.  After you shampoo, rinse your hair and body thoroughly to remove the                      Shampoo.  4.  Use CHG as you would any other liquid soap.  You can apply chg directly       to the skin and wash gently with scrungie or a clean washcloth.  5.  Apply the CHG Soap to your body ONLY FROM THE NECK DOWN.        Do not use on open wounds or open sores.  Avoid contact with your eyes, ears, mouth and genitals (private parts).  Wash genitals (private parts) with your normal soap.  6.  Wash thoroughly, paying special attention to the area where your surgery  will be performed.  7.  Thoroughly rinse your body with warm water from the neck down.  8.  DO NOT shower/wash with your normal soap after using and rinsing off       the CHG Soap.  9.  Pat yourself dry with a clean towel.            10.  Wear clean pajamas.            11.  Place clean sheets on your bed the night of your first shower and do not        sleep with pets.  Day of Surgery  Do not apply any lotions the morning of surgery.  Please wear clean clothes to the hospital.   Please read over the following fact sheets that you were given. Pain Booklet, Coughing and Deep Breathing and Surgical Site Infection Prevention

## 2015-10-23 NOTE — Progress Notes (Signed)
Pt with hx of diabetes and hypertension but states has not been on medication since had her gastric sleeve placed.  Denies cardiologist PCP Dr Johny SaxMc Neil [facility] [place] [facility] [place] [facility]

## 2015-10-27 MED ORDER — DEXTROSE 5 % IV SOLN
3.0000 g | INTRAVENOUS | Status: AC
  Administered 2015-10-28: 2 g via INTRAVENOUS
  Filled 2015-10-27: qty 3000

## 2015-10-27 NOTE — Progress Notes (Signed)
I spoke with patient's son Carey BullocksJarrell and informed him of time change, arrival now time is 9:10 AM

## 2015-10-28 ENCOUNTER — Ambulatory Visit (HOSPITAL_COMMUNITY): Payer: 59 | Admitting: Anesthesiology

## 2015-10-28 ENCOUNTER — Ambulatory Visit (HOSPITAL_COMMUNITY): Payer: 59

## 2015-10-28 ENCOUNTER — Encounter (HOSPITAL_COMMUNITY): Payer: Self-pay | Admitting: Anesthesiology

## 2015-10-28 ENCOUNTER — Ambulatory Visit (HOSPITAL_COMMUNITY)
Admission: RE | Admit: 2015-10-28 | Discharge: 2015-10-28 | Disposition: A | Discharge status: Home / Self Care | Payer: 59 | Source: Ambulatory Visit | Attending: Surgery | Admitting: Surgery

## 2015-10-28 ENCOUNTER — Encounter (HOSPITAL_COMMUNITY)
Admission: RE | Disposition: A | Discharge status: Home / Self Care | Payer: Self-pay | Source: Ambulatory Visit | Attending: Surgery

## 2015-10-28 DIAGNOSIS — Z6834 Body mass index (BMI) 34.0-34.9, adult: Secondary | ICD-10-CM | POA: Insufficient documentation

## 2015-10-28 DIAGNOSIS — I10 Essential (primary) hypertension: Secondary | ICD-10-CM | POA: Diagnosis not present

## 2015-10-28 DIAGNOSIS — J449 Chronic obstructive pulmonary disease, unspecified: Secondary | ICD-10-CM | POA: Diagnosis not present

## 2015-10-28 DIAGNOSIS — K8010 Calculus of gallbladder with chronic cholecystitis without obstruction: Secondary | ICD-10-CM | POA: Diagnosis present

## 2015-10-28 DIAGNOSIS — K219 Gastro-esophageal reflux disease without esophagitis: Secondary | ICD-10-CM | POA: Insufficient documentation

## 2015-10-28 DIAGNOSIS — E669 Obesity, unspecified: Secondary | ICD-10-CM | POA: Insufficient documentation

## 2015-10-28 DIAGNOSIS — E119 Type 2 diabetes mellitus without complications: Secondary | ICD-10-CM | POA: Diagnosis not present

## 2015-10-28 DIAGNOSIS — Z419 Encounter for procedure for purposes other than remedying health state, unspecified: Secondary | ICD-10-CM

## 2015-10-28 DIAGNOSIS — Z87891 Personal history of nicotine dependence: Secondary | ICD-10-CM | POA: Diagnosis not present

## 2015-10-28 DIAGNOSIS — M1990 Unspecified osteoarthritis, unspecified site: Secondary | ICD-10-CM | POA: Diagnosis not present

## 2015-10-28 HISTORY — PX: CHOLECYSTECTOMY: SHX55

## 2015-10-28 HISTORY — DX: Hypoglycemia, unspecified: E16.2

## 2015-10-28 LAB — GLUCOSE, CAPILLARY
GLUCOSE-CAPILLARY: 71 mg/dL (ref 65–99)
GLUCOSE-CAPILLARY: 114 mg/dL — AB (ref 65–99)
Glucose-Capillary: 90 mg/dL (ref 65–99)

## 2015-10-28 SURGERY — LAPAROSCOPIC CHOLECYSTECTOMY WITH INTRAOPERATIVE CHOLANGIOGRAM
Anesthesia: General | Site: Abdomen

## 2015-10-28 MED ORDER — ROCURONIUM 10MG/ML (10ML) SYRINGE FOR MEDFUSION PUMP - OPTIME
INTRAVENOUS | Status: DC | PRN
  Administered 2015-10-28: 40 mg via INTRAVENOUS

## 2015-10-28 MED ORDER — ONDANSETRON HCL 4 MG/2ML IJ SOLN
INTRAMUSCULAR | Status: DC | PRN
  Administered 2015-10-28: 4 mg via INTRAVENOUS

## 2015-10-28 MED ORDER — LIDOCAINE 2% (20 MG/ML) 5 ML SYRINGE
INTRAMUSCULAR | Status: AC
  Filled 2015-10-28: qty 5

## 2015-10-28 MED ORDER — PROPOFOL 10 MG/ML IV BOLUS
INTRAVENOUS | Status: AC
  Filled 2015-10-28: qty 20

## 2015-10-28 MED ORDER — ROCURONIUM BROMIDE 50 MG/5ML IV SOLN
INTRAVENOUS | Status: AC
  Filled 2015-10-28: qty 1

## 2015-10-28 MED ORDER — MEPERIDINE HCL 25 MG/ML IJ SOLN: 6.2500 mg | INTRAMUSCULAR | Status: DC | PRN

## 2015-10-28 MED ORDER — FENTANYL CITRATE (PF) 100 MCG/2ML IJ SOLN
INTRAMUSCULAR | Status: DC | PRN
  Administered 2015-10-28 (×2): 50 ug via INTRAVENOUS
  Administered 2015-10-28: 100 ug via INTRAVENOUS
  Administered 2015-10-28: 50 ug via INTRAVENOUS

## 2015-10-28 MED ORDER — DEXTROSE 50 % IV SOLN
12.5000 g | Freq: Once | INTRAVENOUS | Status: AC
  Administered 2015-10-28: 12.5 g via INTRAVENOUS
  Filled 2015-10-28: qty 50

## 2015-10-28 MED ORDER — METOCLOPRAMIDE HCL 5 MG/ML IJ SOLN: 10.0000 mg | Freq: Once | INTRAMUSCULAR | Status: DC | PRN

## 2015-10-28 MED ORDER — DEXTROSE 50 % IV SOLN
INTRAVENOUS | Status: AC
  Administered 2015-10-28: 12.5 g via INTRAVENOUS
  Filled 2015-10-28: qty 50

## 2015-10-28 MED ORDER — PROPOFOL 10 MG/ML IV BOLUS
INTRAVENOUS | Status: DC | PRN
  Administered 2015-10-28: 150 mg via INTRAVENOUS
  Administered 2015-10-28: 30 mg via INTRAVENOUS

## 2015-10-28 MED ORDER — HYDROMORPHONE HCL 1 MG/ML IJ SOLN
INTRAMUSCULAR | Status: AC
  Filled 2015-10-28: qty 1

## 2015-10-28 MED ORDER — DEXAMETHASONE SODIUM PHOSPHATE 10 MG/ML IJ SOLN
INTRAMUSCULAR | Status: AC
  Filled 2015-10-28: qty 1

## 2015-10-28 MED ORDER — LACTATED RINGERS IV SOLN
INTRAVENOUS | Status: DC
  Administered 2015-10-28 (×2): via INTRAVENOUS

## 2015-10-28 MED ORDER — SODIUM CHLORIDE 0.9 % IR SOLN
Status: DC | PRN
  Administered 2015-10-28: 1000 mL

## 2015-10-28 MED ORDER — SODIUM CHLORIDE 0.9 % IV SOLN
INTRAVENOUS | Status: DC | PRN
  Administered 2015-10-28: 10 mL

## 2015-10-28 MED ORDER — OXYCODONE-ACETAMINOPHEN 5-325 MG PO TABS: 1.0000 | ORAL_TABLET | ORAL | Status: AC | PRN

## 2015-10-28 MED ORDER — BUPIVACAINE-EPINEPHRINE (PF) 0.25% -1:200000 IJ SOLN
INTRAMUSCULAR | Status: AC
  Filled 2015-10-28: qty 30

## 2015-10-28 MED ORDER — HYDROMORPHONE HCL 1 MG/ML IJ SOLN
0.2500 mg | INTRAMUSCULAR | Status: DC | PRN
  Administered 2015-10-28: 0.5 mg via INTRAVENOUS
  Administered 2015-10-28 (×2): 0.25 mg via INTRAVENOUS

## 2015-10-28 MED ORDER — SCOPOLAMINE 1 MG/3DAYS TD PT72
1.0000 | MEDICATED_PATCH | TRANSDERMAL | Status: DC
  Administered 2015-10-28: 1.5 mg via TRANSDERMAL
  Filled 2015-10-28: qty 1

## 2015-10-28 MED ORDER — BUPIVACAINE-EPINEPHRINE 0.25% -1:200000 IJ SOLN
INTRAMUSCULAR | Status: DC | PRN
  Administered 2015-10-28: 9 mL

## 2015-10-28 MED ORDER — MIDAZOLAM HCL 2 MG/2ML IJ SOLN
INTRAMUSCULAR | Status: AC
  Filled 2015-10-28: qty 2

## 2015-10-28 MED ORDER — LIDOCAINE HCL (CARDIAC) 20 MG/ML IV SOLN
INTRAVENOUS | Status: DC | PRN
  Administered 2015-10-28: 60 mg via INTRAVENOUS

## 2015-10-28 MED ORDER — DEXAMETHASONE SODIUM PHOSPHATE 4 MG/ML IJ SOLN
INTRAMUSCULAR | Status: DC | PRN
  Administered 2015-10-28: 10 mg via INTRAVENOUS

## 2015-10-28 MED ORDER — FENTANYL CITRATE (PF) 250 MCG/5ML IJ SOLN
INTRAMUSCULAR | Status: AC
  Filled 2015-10-28: qty 5

## 2015-10-28 MED ORDER — CHLORHEXIDINE GLUCONATE 4 % EX LIQD: 1.0000 "application " | Freq: Once | CUTANEOUS | Status: DC

## 2015-10-28 MED ORDER — IOPAMIDOL (ISOVUE-300) INJECTION 61%
INTRAVENOUS | Status: AC
  Filled 2015-10-28: qty 50

## 2015-10-28 MED ORDER — ONDANSETRON HCL 4 MG/2ML IJ SOLN
INTRAMUSCULAR | Status: AC
  Filled 2015-10-28: qty 2

## 2015-10-28 MED ORDER — MIDAZOLAM HCL 5 MG/5ML IJ SOLN
INTRAMUSCULAR | Status: DC | PRN
  Administered 2015-10-28: 2 mg via INTRAVENOUS

## 2015-10-28 MED ORDER — SUGAMMADEX SODIUM 200 MG/2ML IV SOLN
INTRAVENOUS | Status: DC | PRN
  Administered 2015-10-28: 200 mg via INTRAVENOUS

## 2015-10-28 MED ORDER — ESMOLOL HCL 100 MG/10ML IV SOLN
INTRAVENOUS | Status: DC | PRN
  Administered 2015-10-28: 30 mg via INTRAVENOUS
  Administered 2015-10-28: 20 mg via INTRAVENOUS

## 2015-10-28 MED ORDER — 0.9 % SODIUM CHLORIDE (POUR BTL) OPTIME
TOPICAL | Status: DC | PRN
  Administered 2015-10-28: 1000 mL

## 2015-10-28 SURGICAL SUPPLY — 45 items
APPLIER CLIP ROT 10 11.4 M/L (STAPLE) ×3
BLADE SURG ROTATE 9660 (MISCELLANEOUS) IMPLANT
CANISTER SUCTION 2500CC (MISCELLANEOUS) ×3 IMPLANT
CHLORAPREP W/TINT 26ML (MISCELLANEOUS) ×3 IMPLANT
CLIP APPLIE ROT 10 11.4 M/L (STAPLE) ×1 IMPLANT
COVER MAYO STAND STRL (DRAPES) ×3 IMPLANT
COVER SURGICAL LIGHT HANDLE (MISCELLANEOUS) ×3 IMPLANT
DRAPE C-ARM 42X72 X-RAY (DRAPES) ×3 IMPLANT
DRAPE WARM FLUID 44X44 (DRAPE) ×3 IMPLANT
ELECT REM PT RETURN 9FT ADLT (ELECTROSURGICAL) ×3
ELECTRODE REM PT RTRN 9FT ADLT (ELECTROSURGICAL) ×1 IMPLANT
GLOVE BIO SURGEON STRL SZ7 (GLOVE) ×3 IMPLANT
GLOVE BIO SURGEON STRL SZ8 (GLOVE) ×3 IMPLANT
GLOVE BIOGEL PI IND STRL 6 (GLOVE) ×1 IMPLANT
GLOVE BIOGEL PI IND STRL 6.5 (GLOVE) ×1 IMPLANT
GLOVE BIOGEL PI IND STRL 7.0 (GLOVE) ×4 IMPLANT
GLOVE BIOGEL PI IND STRL 8 (GLOVE) ×1 IMPLANT
GLOVE BIOGEL PI INDICATOR 6 (GLOVE) ×2
GLOVE BIOGEL PI INDICATOR 6.5 (GLOVE) ×2
GLOVE BIOGEL PI INDICATOR 7.0 (GLOVE) ×8
GLOVE BIOGEL PI INDICATOR 8 (GLOVE) ×2
GLOVE SURG SS PI 6.5 STRL IVOR (GLOVE) ×3 IMPLANT
GOWN STRL REUS W/ TWL LRG LVL3 (GOWN DISPOSABLE) ×2 IMPLANT
GOWN STRL REUS W/ TWL XL LVL3 (GOWN DISPOSABLE) ×1 IMPLANT
GOWN STRL REUS W/TWL LRG LVL3 (GOWN DISPOSABLE) ×4
GOWN STRL REUS W/TWL XL LVL3 (GOWN DISPOSABLE) ×2
KIT BASIN OR (CUSTOM PROCEDURE TRAY) ×3 IMPLANT
KIT ROOM TURNOVER OR (KITS) ×3 IMPLANT
LIQUID BAND (GAUZE/BANDAGES/DRESSINGS) ×3 IMPLANT
NS IRRIG 1000ML POUR BTL (IV SOLUTION) ×3 IMPLANT
PAD ARMBOARD 7.5X6 YLW CONV (MISCELLANEOUS) ×9 IMPLANT
POUCH SPECIMEN RETRIEVAL 10MM (ENDOMECHANICALS) ×3 IMPLANT
SCISSORS LAP 5X35 DISP (ENDOMECHANICALS) ×3 IMPLANT
SET CHOLANGIOGRAPH 5 50 .035 (SET/KITS/TRAYS/PACK) ×3 IMPLANT
SET IRRIG TUBING LAPAROSCOPIC (IRRIGATION / IRRIGATOR) ×3 IMPLANT
SLEEVE ENDOPATH XCEL 5M (ENDOMECHANICALS) ×3 IMPLANT
SPECIMEN JAR SMALL (MISCELLANEOUS) ×3 IMPLANT
SUT MNCRL AB 4-0 PS2 18 (SUTURE) ×3 IMPLANT
TOWEL OR 17X24 6PK STRL BLUE (TOWEL DISPOSABLE) ×3 IMPLANT
TOWEL OR 17X26 10 PK STRL BLUE (TOWEL DISPOSABLE) ×3 IMPLANT
TRAY LAPAROSCOPIC MC (CUSTOM PROCEDURE TRAY) ×3 IMPLANT
TROCAR XCEL BLUNT TIP 100MML (ENDOMECHANICALS) ×3 IMPLANT
TROCAR XCEL NON-BLD 11X100MML (ENDOMECHANICALS) ×3 IMPLANT
TROCAR XCEL NON-BLD 5MMX100MML (ENDOMECHANICALS) ×3 IMPLANT
TUBING INSUFFLATION (TUBING) ×3 IMPLANT

## 2015-10-28 NOTE — Transfer of Care (Signed)
Immediate Anesthesia Transfer of Care Note  Patient: Jessica Boone  Procedure(s) Performed: Procedure(s): LAPAROSCOPIC CHOLECYSTECTOMY WITH INTRAOPERATIVE CHOLANGIOGRAM (N/A)  Patient Location: PACU  Anesthesia Type:General  Level of Consciousness: awake, alert  and oriented  Airway & Oxygen Therapy: Patient Spontanous Breathing and Patient connected to face mask oxygen  Post-op Assessment: Report given to RN and Post -op Vital signs reviewed and stable  Post vital signs: Reviewed and stable  Last Vitals:  Filed Vitals:   10/28/15 0952  BP: 129/80  Pulse: 85  Temp: 36.9 C  Resp: 20    Last Pain: There were no vitals filed for this visit.       Complications: No apparent anesthesia complications

## 2015-10-28 NOTE — Discharge Instructions (Signed)
[facility] ______[facility] °LAPAROSCOPIC SURGERY: POST OP INSTRUCTIONS °Always review your discharge instruction sheet given to you by the facility where your surgery was performed. °IF YOU HAVE DISABILITY OR FAMILY LEAVE FORMS, YOU MUST BRING THEM TO THE OFFICE FOR PROCESSING.   °DO NOT GIVE THEM TO YOUR DOCTOR. ° °1. A prescription for pain medication may be given to you upon discharge.  Take your pain medication as prescribed, if needed.  If narcotic pain medicine is not needed, then you may take acetaminophen (Tylenol) or ibuprofen (Advil) as needed. °2. Take your usually prescribed medications unless otherwise directed. °3. If you need a refill on your pain medication, please contact your pharmacy.  They will contact our office to request authorization. Prescriptions will not be filled after 5pm or on week-ends. °4. You should follow a light diet the first few days after arrival home, such as soup and crackers, etc.  Be sure to include lots of fluids daily. °5. Most patients will experience some swelling and bruising in the area of the incisions.  Ice packs will help.  Swelling and bruising can take several days to resolve.  °6. It is common to experience some constipation if taking pain medication after surgery.  Increasing fluid intake and taking a stool softener (such as Colace) will usually help or prevent this problem from occurring.  A mild laxative (Milk of Magnesia or Miralax) should be taken according to package instructions if there are no bowel movements after 48 hours. °7. Unless discharge instructions indicate otherwise, you may remove your bandages 24-48 hours after surgery, and you may shower at that time.  You may have steri-strips (small skin tapes) in place directly over the incision.  These strips should be left on the skin for 7-10 days.  If your surgeon used skin glue on the incision, you may shower in 24 hours.  The glue will flake off over the next 2-3 weeks.  Any sutures or  staples will be removed at the office during your follow-up visit. °8. ACTIVITIES:  You may resume regular (light) daily activities beginning the next day--such as daily self-care, walking, climbing stairs--gradually increasing activities as tolerated.  You may have sexual intercourse when it is comfortable.  Refrain from any heavy lifting or straining until approved by your doctor. °a. You may drive when you are no longer taking prescription pain medication, you can comfortably wear a seatbelt, and you can safely maneuver your car and apply brakes. °b. RETURN TO WORK:  __________________________________________________________ °9. You should see your doctor in the office for a follow-up appointment approximately 2-3 weeks after your surgery.  Make sure that you call for this appointment within a day or two after you arrive home to insure a convenient appointment time. °10. OTHER INSTRUCTIONS: __________________________________________________________________________________________________________________________ __________________________________________________________________________________________________________________________ °WHEN TO CALL YOUR DOCTOR: °1. Fever over 101.0 °2. Inability to urinate °3. Continued bleeding from incision. °4. Increased pain, redness, or drainage from the incision. °5. Increasing abdominal pain ° °The clinic staff is available to answer your questions during regular business hours.  Please don’t hesitate to call and ask to speak to one of the nurses for clinical concerns.  If you have a medical emergency, go to the nearest emergency room or call 911.  A surgeon from [facility] is always on call at the hospital. °[address], [address], [place], [place]  [address] ? [address] [address], [place],    [address] °[phone] ? [phone] ? [phone] [phone] °Web site:   [email or web address] °

## 2015-10-28 NOTE — Op Note (Signed)
Laparoscopic Cholecystectomy with IOC Procedure Note  Indications: This patient presents with symptomatic gallbladder disease and will undergo laparoscopic cholecystectomy. The procedure has been discussed with the patient. Operative and non operative treatments have been discussed. Risks of surgery include bleeding, infection,  Common bile duct injury,  Injury to the stomach,liver, colon,small intestine, abdominal wall,  Diaphragm,  Major blood vessels,  And the need for an open procedure.  Other risks include worsening of medical problems, death,  DVT and pulmonary embolism, and cardiovascular events.   Medical options have also been discussed. The patient has been informed of long term expectations of surgery and non surgical options,  The patient agrees to proceed.    Pre-operative Diagnosis: Calculus of gallbladder without mention of cholecystitis or obstruction  Post-operative Diagnosis: Same  Surgeon: Jessicaann Overbaugh A.   Assistants: Hitchcock RNFA   Anesthesia: General endotracheal anesthesia and Local anesthesia 0.25.% bupivacaine, with epinephrine  ASA Class: 2  Procedure Details  The patient was seen again in the Holding Room. The risks, benefits, complications, treatment options, and expected outcomes were discussed with the patient. The possibilities of reaction to medication, pulmonary aspiration, perforation of viscus, bleeding, recurrent infection, finding a normal gallbladder, the need for additional procedures, failure to diagnose a condition, the possible need to convert to an open procedure, and creating a complication requiring transfusion or operation were discussed with the patient. The patient and/or family concurred with the proposed plan, giving informed consent. The site of surgery properly noted/marked. The patient was taken to Operating Room, identified as [facility] and the procedure verified as Laparoscopic Cholecystectomy with Intraoperative Cholangiograms. A Time  Out was held and the above information confirmed.  Prior to the induction of general anesthesia, antibiotic prophylaxis was administered. General endotracheal anesthesia was then administered and tolerated well. After the induction, the abdomen was prepped in the usual sterile fashion. The patient was positioned in the supine position with the left arm comfortably tucked, along with some reverse Trendelenburg.  Local anesthetic agent was injected into the skin near the umbilicus and an incision made. The midline fascia was incised and the Hasson technique was used to introduce a 12 mm port under direct vision. It was secured with a figure of eight Vicryl suture placed in the usual fashion. Pneumoperitoneum was then created with CO2 and tolerated well without any adverse changes in the patient's vital signs. Additional trocars were introduced under direct vision with an 11 mm trocar in the epigastrium and two  5 mm trocars in the right upper quadrant. All skin incisions were infiltrated with a local anesthetic agent before making the incision and placing the trocars.   The gallbladder was identified, the fundus grasped and retracted cephalad. Adhesions were lysed bluntly and with the electrocautery where indicated, taking care not to injure any adjacent organs or viscus. The infundibulum was grasped and retracted laterally, exposing the peritoneum overlying the triangle of Calot. This was then divided and exposed in a blunt fashion. The cystic duct was clearly identified and bluntly dissected circumferentially. The junctions of the gallbladder, cystic duct and common bile duct were clearly identified prior to the division of any linear structure.   An incision was made in the cystic duct and the cholangiogram catheter introduced. The catheter was secured using an endoclip. The study showed no stones and good visualization of the distal and proximal biliary tree. The catheter was then removed.   The cystic  duct was then  ligated with surgical clips  on the  patient side and  clipped on the gallbladder side and divided. The cystic artery was identified, dissected free, ligated with clips and divided as well. Posterior cystic artery clipped and divided.  The gallbladder was dissected from the liver bed in retrograde fashion with the electrocautery. The gallbladder was removed. The liver bed was irrigated and inspected. Hemostasis was achieved with the electrocautery. Copious irrigation was utilized and was repeatedly aspirated until clear all particulate matter. Hemostasis was achieved with no signs  Of bleeding or bile leakage.  Pneumoperitoneum was completely reduced after viewing removal of the trocars under direct vision. The wound was thoroughly irrigated and the fascia was then closed with a figure of eight suture; the skin was then closed with 4 O monocryl  and a sterile dressing was applied of Liquid adhesive.  Instrument, sponge, and needle counts were correct at closure and at the conclusion of the case.   Findings: Cholelithiasis  Estimated Blood Loss: Minimal         Drains: none         Total IV Fluids:   800 mL         Specimens: Gallbladder           Complications: None; patient tolerated the procedure well.         Disposition: PACU - hemodynamically stable.         Condition: stable

## 2015-10-28 NOTE — Anesthesia Procedure Notes (Signed)
Procedure Name: Intubation Date/Time: 10/28/2015 11:54 AM Performed by: Burna CashONRAD, Shaneque Merkle C Pre-anesthesia Checklist: Patient identified, Emergency Drugs available, Suction available and Patient being monitored Patient Re-evaluated:Patient Re-evaluated prior to inductionOxygen Delivery Method: Circle system utilized Preoxygenation: Pre-oxygenation with 100% oxygen Intubation Type: IV induction Ventilation: Mask ventilation without difficulty Laryngoscope Size: Mac and 3 Grade View: Grade I Tube type: Oral Tube size: 7.5 mm Number of attempts: 1 Airway Equipment and Method: Stylet and Oral airway Placement Confirmation: ETT inserted through vocal cords under direct vision,  positive ETCO2 and breath sounds checked- equal and bilateral Secured at: 21 cm Tube secured with: Tape Dental Injury: Teeth and Oropharynx as per pre-operative assessment

## 2015-10-28 NOTE — Interval H&P Note (Signed)
History and Physical Interval Note:  10/28/2015 11:23 AM  Jessica Boone  has presented today for surgery, with the diagnosis of Gallstones   The various methods of treatment have been discussed with the patient and family. After consideration of risks, benefits and other options for treatment, the patient has consented to  Procedure(s): LAPAROSCOPIC CHOLECYSTECTOMY WITH INTRAOPERATIVE CHOLANGIOGRAM (N/A) as a surgical intervention .  The patient's history has been reviewed, patient examined, no change in status, stable for surgery.  I have reviewed the patient's chart and labs.  Questions were answered to the patient's satisfaction.     Kazim Corrales A.

## 2015-10-28 NOTE — H&P (Signed)
H&P   Jessica Boone (MR# [id number])      H&P Info    Author Note Status Last Update User Last Update Date/Time   Harriette Bouillonhomas Tameron Lama, MD Signed Harriette Bouillonhomas Jonathen Rathman, MD 09/10/2015 10:32 AM    H&P    Expand All Collapse All   Jessica Boone  [facility] Patient #: [id number] DOB: 06-25-1960 Married / Language: English / Race: Black or African American Female  History of Present Illness Jessica Boone(Jessica Boone A. Cariah Salatino MD;  Patient words: abdominal pain/GB     Patient sent at the request of Dr. Dulce Sellaroutlaw for abdominal pain. She has a history of a gastric sleeve procedure in 2014. She lost 80 pounds or so but is gaining some of that back. She has had intermittent epigastric and chest pain after eating. The pain is sharp in nature locations epigastrium and right upper quadrant associated with nausea and indigestion. The pain comes and goes. There is no significant relationship to food she states. She has 3-4 tacks a month which last a few minutes to 1 hour or so. Her last one ended up taking her to the emergency room. Ultrasound shows multiple gallstones without gallbladder wall thickening nor dilation of the common duct. Today she is asymptomatic.  The patient is a 55 year old female.   Other Problems Jessica Boone(Jessica Boone, CMA;  Arthritis Back Pain Chronic Obstructive Lung Disease Depression Diabetes Mellitus Diverticulosis Gastroesophageal Reflux Disease High blood pressure Hypercholesterolemia Other disease, cancer, significant illness  Past Surgical History Jessica Boone(Jessica Boone, CMA; Cesarean Section - 1 Hysterectomy (not due to cancer) - Partial Resection of Stomach Sleeve Gastrectomy  Diagnostic Studies History Jessica Boone(Jessica Boone, CMA;  Colonoscopy within last year Mammogram 1-3 years ago Pap Smear 1-5 years ago  Allergies Jessica Boone(Jessica Boone, CMA;  TraZODone HCl *ANTIDEPRESSANTS*  Medication History Jessica Boone(Jessica Boone, CMA;  Esomeprazole  Magnesium (40MG  Capsule DR, Oral) Active. Medications Reconciled  Social History Jessica Boone([place], [place];  Alcohol use Occasional alcohol use. Caffeine use Coffee, Tea. No drug use Tobacco use Former smoker.  Family History Jessica Boone(Jessica [place]one, [place];  Alcohol Abuse Father. Arthritis Brother, Mother. Hypertension Daughter, Mother. Malignant Neoplasm Of Pancreas Father. Thyroid problems Mother.  Pregnancy / Birth History Jessica Boone(Jessica [place], [place];  Age at menarche 9 years. Age of menopause <45 Gravida 4 Irregular periods Maternal age 55-20 Para 2     Review of Systems Jessica Boone(Jessica R. Boone CMA;  General Present- Chills and Weight Gain. Not Present- Appetite Loss, Fatigue, Fever, Night Sweats and Weight Loss. Skin Present- Dryness and Non-Healing Wounds. Not Present- Change in Wart/Mole, Hives, Jaundice, New Lesions, Rash and Ulcer. HEENT Present- Seasonal Allergies, Sinus Pain and Wears glasses/contact lenses. Not Present- Earache, Hearing Loss, Hoarseness, Nose Bleed, Oral Ulcers, Ringing in the Ears, Sore Throat, Visual Disturbances and Yellow Eyes. Breast Not Present- Breast Mass, Breast Pain, Nipple Discharge and Skin Changes. Cardiovascular Present- Swelling of Extremities. Not Present- Chest Pain, Difficulty Breathing Lying Down, Leg Cramps, Palpitations, Rapid Heart Rate and Shortness of Breath. Gastrointestinal Present- Abdominal Pain, Bloating, Change in Bowel Habits, Constipation, Excessive gas and Gets full quickly at meals. Not Present- Bloody Stool, Chronic diarrhea, Difficulty Swallowing, Hemorrhoids, Indigestion, Nausea, Rectal Pain and Vomiting. Female Genitourinary Not Present- Frequency, Nocturia, Painful Urination, Pelvic Pain and Urgency. Musculoskeletal Present- Joint Pain, Muscle Weakness and Swelling of Extremities. Not Present- Back Pain, Joint Stiffness and Muscle Pain. Neurological Present- Numbness and Tingling. Not Present- Decreased Memory,  Fainting, Headaches, Seizures, Tremor, Trouble walking  and Weakness. Psychiatric Present- Change in Sleep Pattern. Not Present- Anxiety, Bipolar, Depression, Fearful and Frequent crying. Endocrine Present- Cold Intolerance, Hair Changes, Hot flashes and New Diabetes. Not Present- Excessive Hunger and Heat Intolerance. Hematology Not Present- Easy Bruising, Excessive bleeding, Gland problems, HIV and Persistent Infections.  [employer] R. Boone CMA;  09/10/2015 9:21 AM Weight: 181.5 lb Height: 62in Body Surface Area: 1.83 m Body Mass Index: 33.2 kg/m  BP: 116/78 (Sitting, Left Arm, Standard)      Physical Exam (Jessica Deemer A. Everlee Quakenbush MD;   General Mental Status-Alert. General Appearance-Consistent with stated age. Hydration-Well hydrated. Voice-Normal.  Head and Neck Head-normocephalic, atraumatic with no lesions or palpable masses.  Eye Eyeball - Bilateral-Extraocular movements intact. Sclera/Conjunctiva - Bilateral-No scleral icterus.  Chest and Lung Exam Chest and lung exam reveals -quiet, even and easy respiratory effort with no use of accessory muscles and on auscultation, normal breath sounds, no adventitious sounds and normal vocal resonance. Inspection Chest Wall - Normal. Back - normal.  Cardiovascular Cardiovascular examination reveals -on palpation PMI is normal in location and amplitude, no palpable S3 or S4. Normal cardiac borders., normal heart sounds, regular rate and rhythm with no murmurs, carotid auscultation reveals no bruits and normal pedal pulses bilaterally.  Abdomen Inspection Inspection of the abdomen reveals - No Hernias. Skin - Scar - no surgical scars. Palpation/Percussion Palpation and Percussion of the abdomen reveal - Soft, Non Tender, No Rebound tenderness, No Rigidity (guarding) and No hepatosplenomegaly. Auscultation Auscultation of the abdomen reveals - Bowel sounds normal.  Neurologic Neurologic evaluation  reveals -alert and oriented x 3 with no impairment of recent or remote memory. Mental Status-Normal.  Musculoskeletal Normal Exam - Left-Upper Extremity Strength Normal and Lower Extremity Strength Normal. Normal Exam - Right-Upper Extremity Strength Normal, Lower Extremity Weakness.    Assessment & Plan (Morrill Bomkamp A. Shakayla Hickox MD;   SYMPTOMATIC CHOLELITHIASIS (K80.20) Impression: Discussed options of laparoscopic cholecystectomy with cholangiogram versus medical management. Discussed diet modification. Risks, benefits and long-term expectations of each discussed. Patient is opted for laparoscopic cholecystectomy. The procedure has been discussed with the patient. Risks of laparoscopic cholecystectomy include bleeding, infection, bile duct injury, leak, death, open surgery, diarrhea, other surgery, organ injury, blood vessel injury, DVT, and additional care.  Current Plans Pt Education - Pamphlet Given - Laparoscopic Gallbladder Surgery: discussed with patient and provided information. The anatomy & physiology of hepatobiliary & pancreatic function was discussed. The pathophysiology of gallbladder dysfunction was discussed. Natural history risks without surgery was discussed. I feel the risks of no intervention will lead to serious problems that outweigh the operative risks; therefore, I recommended cholecystectomy to remove the pathology. I explained laparoscopic techniques with possible need for an open approach. Probable cholangiogram to evaluate the bilary tract was explained as well.  Risks such as bleeding, infection, abscess, leak, injury to other organs, need for further treatment, heart attack, death, and other risks were discussed. I noted a good likelihood this will help address the problem. Possibility that this will not correct all abdominal symptoms was explained. Goals of post-operative recovery were discussed as well. We will work to minimize complications. An educational handout  further explaining the pathology and treatment options was given as well. Questions were answered. The patient expresses understanding & wishes to proceed with surgery.  Pt Education - [facility] Laparosopic Post Op HCI (Gross) Pt Education - Laparoscopic Cholecystectomy: gallbladder

## 2015-10-28 NOTE — Anesthesia Preprocedure Evaluation (Addendum)
Anesthesia Evaluation  Patient identified by MRN, date of birth, ID band Patient awake    Reviewed: Allergy & Precautions, NPO status , Patient's Chart, lab work & pertinent test results  History of Anesthesia Complications (+) PONV  Airway Mallampati: I  TM Distance: >3 FB Neck ROM: Full    Dental no notable dental hx. (+) Teeth Intact   Pulmonary former smoker,    Pulmonary exam normal breath sounds clear to auscultation       Cardiovascular hypertension, Normal cardiovascular exam Rhythm:Regular Rate:Normal     Neuro/Psych PSYCHIATRIC DISORDERS Depression negative neurological ROS     GI/Hepatic Neg liver ROS, GERD  Medicated and Controlled,Hx/o gastric sleeve operation for [place] Cholelithiasis   Endo/Other  diabetes, Well Controlled, Type 2Obesity  Renal/GU negative Renal ROS  negative genitourinary   Musculoskeletal  (+) Arthritis ,   Abdominal (+) + obese,   Peds  Hematology   Anesthesia Other Findings   Reproductive/Obstetrics                            Anesthesia Physical Anesthesia Plan  ASA: II  Anesthesia Plan: General   Post-op Pain Management:    Induction: Intravenous and Cricoid pressure planned  Airway Management Planned: Oral ETT  Additional Equipment:   Intra-op Plan:   Post-operative Plan: Extubation in OR  Informed Consent: I have reviewed the patients History and Physical, chart, labs and discussed the procedure including the risks, benefits and alternatives for the proposed anesthesia with the patient or authorized representative who has indicated his/her understanding and acceptance.   Dental advisory given  Plan Discussed with: CRNA, Anesthesiologist and Surgeon  Anesthesia Plan Comments:         Anesthesia Quick Evaluation

## 2015-10-29 ENCOUNTER — Encounter (HOSPITAL_COMMUNITY): Payer: Self-pay | Admitting: Surgery

## 2015-10-29 NOTE — Anesthesia Postprocedure Evaluation (Signed)
Anesthesia Post Note  [facility]  Procedure(s) Performed: Procedure(s) (LRB): LAPAROSCOPIC CHOLECYSTECTOMY WITH INTRAOPERATIVE CHOLANGIOGRAM (N/A)  Patient location during evaluation: PACU Anesthesia Type: General Level of consciousness: awake and alert and oriented Pain management: pain level controlled Vital Signs Assessment: post-procedure vital signs reviewed and stable Respiratory status: spontaneous breathing, nonlabored ventilation and respiratory function stable Cardiovascular status: blood pressure returned to baseline and stable Postop Assessment: no signs of nausea or vomiting Anesthetic complications: no     Last Vitals:  Filed Vitals:   10/28/15 1426 10/28/15 1434  BP: 147/85 168/80  Pulse: 92 84  Temp: 36.7 C   Resp: 14 16    Last Pain:  Filed Vitals:   10/29/15 1541  PainSc: 6    Pain Goal:                 Becka Lagasse A.

## 2015-11-14 ENCOUNTER — Other Ambulatory Visit: Payer: Self-pay | Admitting: Family Medicine

## 2015-11-14 DIAGNOSIS — Z1231 Encounter for screening mammogram for malignant neoplasm of breast: Secondary | ICD-10-CM

## 2015-11-24 ENCOUNTER — Ambulatory Visit
Admission: RE | Admit: 2015-11-24 | Discharge: 2015-11-24 | Disposition: A | Discharge status: Home / Self Care | Payer: 59 | Source: Ambulatory Visit | Attending: Family Medicine | Admitting: Family Medicine

## 2015-11-24 DIAGNOSIS — Z1231 Encounter for screening mammogram for malignant neoplasm of breast: Secondary | ICD-10-CM

## 2015-12-10 ENCOUNTER — Other Ambulatory Visit: Payer: Self-pay | Admitting: Family Medicine

## 2015-12-11 ENCOUNTER — Other Ambulatory Visit: Payer: Self-pay | Admitting: Family Medicine

## 2015-12-11 DIAGNOSIS — R911 Solitary pulmonary nodule: Secondary | ICD-10-CM

## 2015-12-29 ENCOUNTER — Ambulatory Visit
Admission: RE | Admit: 2015-12-29 | Discharge: 2015-12-29 | Disposition: A | Discharge status: Home / Self Care | Payer: 59 | Source: Ambulatory Visit | Attending: Family Medicine | Admitting: Family Medicine

## 2015-12-29 DIAGNOSIS — R911 Solitary pulmonary nodule: Secondary | ICD-10-CM

## 2016-10-19 ENCOUNTER — Other Ambulatory Visit: Payer: Self-pay | Admitting: Family Medicine

## 2016-10-19 DIAGNOSIS — Z1231 Encounter for screening mammogram for malignant neoplasm of breast: Secondary | ICD-10-CM

## 2016-11-29 ENCOUNTER — Ambulatory Visit
Admission: RE | Admit: 2016-11-29 | Discharge: 2016-11-29 | Disposition: A | Discharge status: Home / Self Care | Payer: 59 | Source: Ambulatory Visit | Attending: Family Medicine | Admitting: Family Medicine

## 2016-11-29 DIAGNOSIS — Z1231 Encounter for screening mammogram for malignant neoplasm of breast: Secondary | ICD-10-CM

## 2017-12-19 ENCOUNTER — Other Ambulatory Visit: Payer: Self-pay | Admitting: Family Medicine

## 2017-12-19 DIAGNOSIS — Z1231 Encounter for screening mammogram for malignant neoplasm of breast: Secondary | ICD-10-CM

## 2018-01-12 ENCOUNTER — Ambulatory Visit
Admission: RE | Admit: 2018-01-12 | Discharge: 2018-01-12 | Disposition: A | Discharge status: Home / Self Care | Payer: 59 | Source: Ambulatory Visit | Attending: Family Medicine | Admitting: Family Medicine

## 2018-01-12 ENCOUNTER — Other Ambulatory Visit: Payer: Self-pay | Admitting: Family Medicine

## 2018-01-12 DIAGNOSIS — E049 Nontoxic goiter, unspecified: Secondary | ICD-10-CM

## 2018-01-12 DIAGNOSIS — Z1231 Encounter for screening mammogram for malignant neoplasm of breast: Secondary | ICD-10-CM

## 2018-01-17 ENCOUNTER — Ambulatory Visit
Admission: RE | Admit: 2018-01-17 | Discharge: 2018-01-17 | Disposition: A | Discharge status: Home / Self Care | Payer: Managed Care, Other (non HMO) | Source: Ambulatory Visit | Attending: Family Medicine | Admitting: Family Medicine

## 2018-01-17 DIAGNOSIS — E049 Nontoxic goiter, unspecified: Secondary | ICD-10-CM

## 2018-01-23 ENCOUNTER — Other Ambulatory Visit: Payer: Self-pay | Admitting: Family Medicine

## 2018-01-23 DIAGNOSIS — R911 Solitary pulmonary nodule: Secondary | ICD-10-CM

## 2018-01-27 ENCOUNTER — Ambulatory Visit
Admission: RE | Admit: 2018-01-27 | Discharge: 2018-01-27 | Disposition: A | Discharge status: Home / Self Care | Payer: Managed Care, Other (non HMO) | Source: Ambulatory Visit | Attending: Family Medicine | Admitting: Family Medicine

## 2018-01-27 DIAGNOSIS — R911 Solitary pulmonary nodule: Secondary | ICD-10-CM

## 2018-12-12 DIAGNOSIS — Z23 Encounter for immunization: Secondary | ICD-10-CM | POA: Diagnosis not present

## 2019-01-05 ENCOUNTER — Other Ambulatory Visit: Payer: Self-pay | Admitting: Family Medicine

## 2019-01-05 DIAGNOSIS — Z1231 Encounter for screening mammogram for malignant neoplasm of breast: Secondary | ICD-10-CM

## 2019-01-31 ENCOUNTER — Other Ambulatory Visit: Payer: Self-pay | Admitting: Family Medicine

## 2019-01-31 DIAGNOSIS — E041 Nontoxic single thyroid nodule: Secondary | ICD-10-CM

## 2019-02-26 ENCOUNTER — Ambulatory Visit: Payer: Managed Care, Other (non HMO)

## 2019-05-03 DIAGNOSIS — B349 Viral infection, unspecified: Secondary | ICD-10-CM | POA: Diagnosis not present

## 2019-05-03 DIAGNOSIS — E119 Type 2 diabetes mellitus without complications: Secondary | ICD-10-CM | POA: Diagnosis not present

## 2019-05-22 DIAGNOSIS — E782 Mixed hyperlipidemia: Secondary | ICD-10-CM | POA: Diagnosis not present

## 2019-05-22 DIAGNOSIS — I10 Essential (primary) hypertension: Secondary | ICD-10-CM | POA: Diagnosis not present

## 2019-05-22 DIAGNOSIS — E1159 Type 2 diabetes mellitus with other circulatory complications: Secondary | ICD-10-CM | POA: Diagnosis not present

## 2019-05-22 DIAGNOSIS — E041 Nontoxic single thyroid nodule: Secondary | ICD-10-CM | POA: Diagnosis not present

## 2019-05-22 DIAGNOSIS — R5383 Other fatigue: Secondary | ICD-10-CM | POA: Diagnosis not present

## 2019-07-04 DIAGNOSIS — L0291 Cutaneous abscess, unspecified: Secondary | ICD-10-CM | POA: Diagnosis not present

## 2019-08-15 DIAGNOSIS — M5430 Sciatica, unspecified side: Secondary | ICD-10-CM | POA: Diagnosis not present

## 2019-08-16 DIAGNOSIS — M5137 Other intervertebral disc degeneration, lumbosacral region: Secondary | ICD-10-CM | POA: Diagnosis not present

## 2019-08-16 DIAGNOSIS — M545 Low back pain: Secondary | ICD-10-CM | POA: Diagnosis not present

## 2019-08-16 DIAGNOSIS — M9903 Segmental and somatic dysfunction of lumbar region: Secondary | ICD-10-CM | POA: Diagnosis not present

## 2019-08-16 DIAGNOSIS — M5117 Intervertebral disc disorders with radiculopathy, lumbosacral region: Secondary | ICD-10-CM | POA: Diagnosis not present

## 2019-08-22 DIAGNOSIS — M5117 Intervertebral disc disorders with radiculopathy, lumbosacral region: Secondary | ICD-10-CM | POA: Diagnosis not present

## 2019-08-22 DIAGNOSIS — M545 Low back pain: Secondary | ICD-10-CM | POA: Diagnosis not present

## 2019-08-22 DIAGNOSIS — M5137 Other intervertebral disc degeneration, lumbosacral region: Secondary | ICD-10-CM | POA: Diagnosis not present

## 2019-08-22 DIAGNOSIS — M9903 Segmental and somatic dysfunction of lumbar region: Secondary | ICD-10-CM | POA: Diagnosis not present

## 2019-08-23 DIAGNOSIS — M5117 Intervertebral disc disorders with radiculopathy, lumbosacral region: Secondary | ICD-10-CM | POA: Diagnosis not present

## 2019-08-23 DIAGNOSIS — M9903 Segmental and somatic dysfunction of lumbar region: Secondary | ICD-10-CM | POA: Diagnosis not present

## 2019-08-23 DIAGNOSIS — M545 Low back pain: Secondary | ICD-10-CM | POA: Diagnosis not present

## 2019-08-23 DIAGNOSIS — M5137 Other intervertebral disc degeneration, lumbosacral region: Secondary | ICD-10-CM | POA: Diagnosis not present

## 2019-08-27 DIAGNOSIS — M5137 Other intervertebral disc degeneration, lumbosacral region: Secondary | ICD-10-CM | POA: Diagnosis not present

## 2019-08-27 DIAGNOSIS — M9903 Segmental and somatic dysfunction of lumbar region: Secondary | ICD-10-CM | POA: Diagnosis not present

## 2019-08-27 DIAGNOSIS — M5117 Intervertebral disc disorders with radiculopathy, lumbosacral region: Secondary | ICD-10-CM | POA: Diagnosis not present

## 2019-08-27 DIAGNOSIS — M545 Low back pain: Secondary | ICD-10-CM | POA: Diagnosis not present

## 2019-08-29 DIAGNOSIS — M5137 Other intervertebral disc degeneration, lumbosacral region: Secondary | ICD-10-CM | POA: Diagnosis not present

## 2019-08-29 DIAGNOSIS — M5117 Intervertebral disc disorders with radiculopathy, lumbosacral region: Secondary | ICD-10-CM | POA: Diagnosis not present

## 2019-08-29 DIAGNOSIS — M9903 Segmental and somatic dysfunction of lumbar region: Secondary | ICD-10-CM | POA: Diagnosis not present

## 2019-08-29 DIAGNOSIS — M545 Low back pain: Secondary | ICD-10-CM | POA: Diagnosis not present

## 2019-09-03 DIAGNOSIS — M545 Low back pain: Secondary | ICD-10-CM | POA: Diagnosis not present

## 2019-09-03 DIAGNOSIS — M9903 Segmental and somatic dysfunction of lumbar region: Secondary | ICD-10-CM | POA: Diagnosis not present

## 2019-09-03 DIAGNOSIS — M5137 Other intervertebral disc degeneration, lumbosacral region: Secondary | ICD-10-CM | POA: Diagnosis not present

## 2019-09-03 DIAGNOSIS — M5117 Intervertebral disc disorders with radiculopathy, lumbosacral region: Secondary | ICD-10-CM | POA: Diagnosis not present

## 2019-09-04 DIAGNOSIS — M9903 Segmental and somatic dysfunction of lumbar region: Secondary | ICD-10-CM | POA: Diagnosis not present

## 2019-09-04 DIAGNOSIS — M5137 Other intervertebral disc degeneration, lumbosacral region: Secondary | ICD-10-CM | POA: Diagnosis not present

## 2019-09-04 DIAGNOSIS — M545 Low back pain: Secondary | ICD-10-CM | POA: Diagnosis not present

## 2019-09-04 DIAGNOSIS — M5117 Intervertebral disc disorders with radiculopathy, lumbosacral region: Secondary | ICD-10-CM | POA: Diagnosis not present

## 2019-09-06 DIAGNOSIS — M5137 Other intervertebral disc degeneration, lumbosacral region: Secondary | ICD-10-CM | POA: Diagnosis not present

## 2019-09-06 DIAGNOSIS — M5117 Intervertebral disc disorders with radiculopathy, lumbosacral region: Secondary | ICD-10-CM | POA: Diagnosis not present

## 2019-09-06 DIAGNOSIS — M9903 Segmental and somatic dysfunction of lumbar region: Secondary | ICD-10-CM | POA: Diagnosis not present

## 2019-09-06 DIAGNOSIS — M545 Low back pain: Secondary | ICD-10-CM | POA: Diagnosis not present

## 2019-09-11 DIAGNOSIS — M9903 Segmental and somatic dysfunction of lumbar region: Secondary | ICD-10-CM | POA: Diagnosis not present

## 2019-09-11 DIAGNOSIS — M545 Low back pain: Secondary | ICD-10-CM | POA: Diagnosis not present

## 2019-09-11 DIAGNOSIS — M5117 Intervertebral disc disorders with radiculopathy, lumbosacral region: Secondary | ICD-10-CM | POA: Diagnosis not present

## 2019-09-11 DIAGNOSIS — M5137 Other intervertebral disc degeneration, lumbosacral region: Secondary | ICD-10-CM | POA: Diagnosis not present

## 2019-09-12 DIAGNOSIS — M545 Low back pain: Secondary | ICD-10-CM | POA: Diagnosis not present

## 2019-09-12 DIAGNOSIS — M9903 Segmental and somatic dysfunction of lumbar region: Secondary | ICD-10-CM | POA: Diagnosis not present

## 2019-09-12 DIAGNOSIS — M5117 Intervertebral disc disorders with radiculopathy, lumbosacral region: Secondary | ICD-10-CM | POA: Diagnosis not present

## 2019-09-12 DIAGNOSIS — M5137 Other intervertebral disc degeneration, lumbosacral region: Secondary | ICD-10-CM | POA: Diagnosis not present

## 2019-09-13 DIAGNOSIS — M5137 Other intervertebral disc degeneration, lumbosacral region: Secondary | ICD-10-CM | POA: Diagnosis not present

## 2019-09-13 DIAGNOSIS — M5117 Intervertebral disc disorders with radiculopathy, lumbosacral region: Secondary | ICD-10-CM | POA: Diagnosis not present

## 2019-09-13 DIAGNOSIS — M545 Low back pain: Secondary | ICD-10-CM | POA: Diagnosis not present

## 2019-09-13 DIAGNOSIS — M9903 Segmental and somatic dysfunction of lumbar region: Secondary | ICD-10-CM | POA: Diagnosis not present

## 2019-11-13 DIAGNOSIS — M542 Cervicalgia: Secondary | ICD-10-CM | POA: Diagnosis not present

## 2019-11-13 DIAGNOSIS — M545 Low back pain: Secondary | ICD-10-CM | POA: Diagnosis not present

## 2019-11-20 DIAGNOSIS — M545 Low back pain: Secondary | ICD-10-CM | POA: Diagnosis not present

## 2019-11-20 DIAGNOSIS — M542 Cervicalgia: Secondary | ICD-10-CM | POA: Diagnosis not present

## 2019-12-24 DIAGNOSIS — M5440 Lumbago with sciatica, unspecified side: Secondary | ICD-10-CM | POA: Diagnosis not present

## 2019-12-24 DIAGNOSIS — R030 Elevated blood-pressure reading, without diagnosis of hypertension: Secondary | ICD-10-CM | POA: Diagnosis not present

## 2020-01-01 DIAGNOSIS — D72829 Elevated white blood cell count, unspecified: Secondary | ICD-10-CM | POA: Diagnosis not present

## 2020-01-01 DIAGNOSIS — Z23 Encounter for immunization: Secondary | ICD-10-CM | POA: Diagnosis not present

## 2020-01-01 DIAGNOSIS — E1159 Type 2 diabetes mellitus with other circulatory complications: Secondary | ICD-10-CM | POA: Diagnosis not present

## 2020-01-01 DIAGNOSIS — R000 Tachycardia, unspecified: Secondary | ICD-10-CM | POA: Diagnosis not present

## 2020-01-01 DIAGNOSIS — I10 Essential (primary) hypertension: Secondary | ICD-10-CM | POA: Diagnosis not present

## 2020-01-01 DIAGNOSIS — H8111 Benign paroxysmal vertigo, right ear: Secondary | ICD-10-CM | POA: Diagnosis not present

## 2020-11-26 ENCOUNTER — Other Ambulatory Visit: Payer: Self-pay | Admitting: Family Medicine

## 2020-11-26 DIAGNOSIS — Z1231 Encounter for screening mammogram for malignant neoplasm of breast: Secondary | ICD-10-CM

## 2020-12-04 ENCOUNTER — Ambulatory Visit
Admission: RE | Admit: 2020-12-04 | Discharge: 2020-12-04 | Disposition: A | Discharge status: Home / Self Care | Payer: BC Managed Care – PPO | Source: Ambulatory Visit | Attending: Family Medicine | Admitting: Family Medicine

## 2020-12-04 ENCOUNTER — Other Ambulatory Visit: Payer: Self-pay

## 2020-12-04 DIAGNOSIS — Z1231 Encounter for screening mammogram for malignant neoplasm of breast: Secondary | ICD-10-CM

## 2021-10-27 ENCOUNTER — Other Ambulatory Visit: Payer: Self-pay | Admitting: Family Medicine

## 2021-10-27 DIAGNOSIS — Z1231 Encounter for screening mammogram for malignant neoplasm of breast: Secondary | ICD-10-CM

## 2022-01-04 ENCOUNTER — Ambulatory Visit
Admission: RE | Admit: 2022-01-04 | Discharge: 2022-01-04 | Disposition: A | Discharge status: Home / Self Care | Payer: BC Managed Care – PPO | Source: Ambulatory Visit | Attending: Family Medicine | Admitting: Family Medicine

## 2022-01-04 DIAGNOSIS — Z1231 Encounter for screening mammogram for malignant neoplasm of breast: Secondary | ICD-10-CM

## 2022-12-30 ENCOUNTER — Other Ambulatory Visit: Payer: Self-pay | Admitting: Family Medicine

## 2022-12-30 DIAGNOSIS — Z1231 Encounter for screening mammogram for malignant neoplasm of breast: Secondary | ICD-10-CM

## 2023-02-02 ENCOUNTER — Ambulatory Visit
Admission: RE | Admit: 2023-02-02 | Discharge: 2023-02-02 | Disposition: A | Discharge status: Home / Self Care | Payer: BC Managed Care – PPO | Source: Ambulatory Visit | Attending: Family Medicine | Admitting: Family Medicine

## 2023-02-02 DIAGNOSIS — Z1231 Encounter for screening mammogram for malignant neoplasm of breast: Secondary | ICD-10-CM

## 2023-10-05 ENCOUNTER — Encounter: Payer: Self-pay | Admitting: Family Medicine

## 2023-10-05 ENCOUNTER — Other Ambulatory Visit: Payer: Self-pay | Admitting: Family Medicine

## 2023-10-05 DIAGNOSIS — E041 Nontoxic single thyroid nodule: Secondary | ICD-10-CM

## 2023-10-06 ENCOUNTER — Ambulatory Visit
Admission: RE | Admit: 2023-10-06 | Discharge: 2023-10-06 | Disposition: A | Discharge status: Home / Self Care | Payer: Self-pay | Source: Ambulatory Visit | Attending: Family Medicine | Admitting: Family Medicine

## 2023-10-06 ENCOUNTER — Other Ambulatory Visit (HOSPITAL_COMMUNITY)
Admission: RE | Admit: 2023-10-06 | Discharge: 2023-10-06 | Disposition: A | Discharge status: Home / Self Care | Source: Ambulatory Visit | Attending: Family Medicine | Admitting: Family Medicine

## 2023-10-06 DIAGNOSIS — E041 Nontoxic single thyroid nodule: Secondary | ICD-10-CM | POA: Diagnosis present

## 2023-10-10 LAB — CYTOLOGY - NON PAP

## 2023-11-03 ENCOUNTER — Encounter: Payer: Self-pay | Admitting: Family Medicine

## 2023-11-03 ENCOUNTER — Other Ambulatory Visit: Payer: Self-pay | Admitting: Family Medicine

## 2023-11-03 DIAGNOSIS — Z122 Encounter for screening for malignant neoplasm of respiratory organs: Secondary | ICD-10-CM

## 2023-11-08 ENCOUNTER — Inpatient Hospital Stay
Admission: RE | Admit: 2023-11-08 | Discharge: 2023-11-08 | Disposition: A | Discharge status: Home / Self Care | Source: Ambulatory Visit | Attending: Family Medicine | Admitting: Family Medicine

## 2023-11-08 DIAGNOSIS — Z122 Encounter for screening for malignant neoplasm of respiratory organs: Secondary | ICD-10-CM

## 2024-01-05 ENCOUNTER — Other Ambulatory Visit: Payer: Self-pay | Admitting: Family Medicine

## 2024-01-05 DIAGNOSIS — Z1231 Encounter for screening mammogram for malignant neoplasm of breast: Secondary | ICD-10-CM

## 2024-01-25 ENCOUNTER — Ambulatory Visit (INDEPENDENT_AMBULATORY_CARE_PROVIDER_SITE_OTHER)

## 2024-01-25 ENCOUNTER — Ambulatory Visit: Admitting: Podiatry

## 2024-01-25 DIAGNOSIS — M19071 Primary osteoarthritis, right ankle and foot: Secondary | ICD-10-CM | POA: Diagnosis not present

## 2024-01-25 MED ORDER — MELOXICAM 15 MG PO TABS: 15.0000 mg | ORAL_TABLET | Freq: Every day | ORAL | 0 refills | Status: AC

## 2024-01-25 MED ORDER — METHYLPREDNISOLONE 4 MG PO TBPK: ORAL_TABLET | ORAL | 0 refills | Status: AC

## 2024-01-25 MED ORDER — TRIAMCINOLONE ACETONIDE 40 MG/ML IJ SUSP
20.0000 mg | Freq: Once | INTRAMUSCULAR | Status: AC
  Administered 2024-01-25: 20 mg

## 2024-01-25 NOTE — Progress Notes (Signed)
 Subjective:  Patient ID: Jessica Boone [facility], female    DOB: 02-Feb-1961,  MRN: [id number] HPI Chief Complaint  Patient presents with   Foot Pain    Right foot and ankle pain   Ankle Pain    New pt- ankle pain right foot x 4 weeks. Have been taking over the counter meds but they are not helping.     63 y.o. female presents with the above complaint.   ROS: Denies fever chills nausea mobic muscle aches pains calf pain back pain chest pain shortness of breath.  Past Medical History:  Diagnosis Date   Arthritis    ankle   Depression    Diabetes mellitus without complication (HCC)    now is not on medications since had gastric sleeve   GERD (gastroesophageal reflux disease)    Hypertension    not being treated now that has lost weight   Hypoglycemia    Vertigo    Past Surgical History:  Procedure Laterality Date   CESAREAN SECTION     cesera     CHOLECYSTECTOMY N/A 10/28/2015   Procedure: LAPAROSCOPIC CHOLECYSTECTOMY WITH INTRAOPERATIVE CHOLANGIOGRAM;  Surgeon: Debby Shipper, MD;  Location: [facility] OR;  Service: General;  Laterality: N/A;   LAPAROSCOPIC GASTRIC SLEEVE RESECTION  01/09/13   PARTIAL HYSTERECTOMY      Current Outpatient Medications:    meloxicam (MOBIC) 15 MG tablet, Take 1 tablet (15 mg total) by mouth daily., Disp: 30 tablet, Rfl: 0   methylPREDNISolone (MEDROL DOSEPAK) 4 MG TBPK tablet, Take 6 tabs the 1st day, take 5 tabs the 2nd day, take 4 tabs the 3rd day, take 3 tabs the 4th day, take 2 tabs the 5th day, and take 1 tab the 6th day, Disp: 21 tablet, Rfl: 0   acetaminophen  (TYLENOL ) 500 MG tablet, Take 500 mg by mouth every 6 (six) hours as needed for mild pain., Disp: , Rfl:    esomeprazole  (NEXIUM ) 20 MG capsule, Take 20 mg by mouth daily at 12 noon., Disp: , Rfl:    fluticasone (FLONASE) 50 MCG/ACT nasal spray, Place 1 spray into both nostrils daily., Disp: , Rfl:    oxyCODONE -acetaminophen  (ROXICET) 5-325 MG tablet, Take 1-2 tablets by mouth every 4 (four) hours as  needed., Disp: 30 tablet, Rfl: 0  Allergies  Allergen Reactions   Trazodone And Nefazodone Shortness Of Breath   Review of Systems Objective:  There were no vitals filed for this visit.  General: Well developed, nourished, in no acute distress, alert and oriented x3   Dermatological: Skin is warm, dry and supple bilateral. Nails x 10 are well maintained; remaining integument appears unremarkable at this time. There are no open sores, no preulcerative lesions, no rash or signs of infection present.  Vascular: Dorsalis Pedis artery and Posterior Tibial artery pedal pulses are 2/4 bilateral with immedate capillary fill time. Pedal hair growth present. No varicosities and no lower extremity edema present bilateral.   Neruologic: Grossly intact via light touch bilateral. Vibratory intact via tuning fork bilateral. Protective threshold with Semmes Wienstein monofilament intact to all pedal sites bilateral. Patellar and Achilles deep tendon reflexes 2+ bilateral. No Babinski or clonus noted bilateral.   Musculoskeletal: No gross boney pedal deformities bilateral. No pain, crepitus, or limitation noted with foot and ankle range of motion bilateral. Muscular strength 5/5 in all groups tested bilateral.  She has pain on palpation of the subtalar joint of her right foot with pain on end range of motion of the subtalar joint.  Sinus  tarsitis palpable.  Gait: Unassisted, Nonantalgic.    Radiographs:  Radiographs taken today demonstrate osseously mature individual with mild degenerative demineralization.  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  Assessment & Plan:   Assessment: Subtalar joint capsulitis right.  Plan: After sterile Betadine skin prep I injected subtalar joint today 20 mg Kenalog 5 mg Marcaine  to the point of maximal tenderness.  Tolerated procedure well without complications.  Started her on methylprednisolone to be followed by meloxicam.  This was  called into [employer] city.  I will follow-up with her as needed.  Discussed appropriate shoe gear.     Dunbar Buras T. [place], [place]

## 2024-02-03 ENCOUNTER — Ambulatory Visit
Admission: RE | Admit: 2024-02-03 | Discharge: 2024-02-03 | Disposition: A | Discharge status: Home / Self Care | Source: Ambulatory Visit | Attending: Family Medicine | Admitting: Family Medicine

## 2024-02-03 DIAGNOSIS — Z1231 Encounter for screening mammogram for malignant neoplasm of breast: Secondary | ICD-10-CM

## 2024-05-01 ENCOUNTER — Other Ambulatory Visit (HOSPITAL_BASED_OUTPATIENT_CLINIC_OR_DEPARTMENT_OTHER): Payer: Self-pay | Admitting: Family Medicine

## 2024-05-01 DIAGNOSIS — E0590 Thyrotoxicosis, unspecified without thyrotoxic crisis or storm: Secondary | ICD-10-CM

## 2024-05-01 DIAGNOSIS — E2839 Other primary ovarian failure: Secondary | ICD-10-CM

## 2024-05-03 ENCOUNTER — Other Ambulatory Visit: Payer: Self-pay | Admitting: Family Medicine

## 2024-05-03 DIAGNOSIS — R911 Solitary pulmonary nodule: Secondary | ICD-10-CM

## 2024-05-24 ENCOUNTER — Other Ambulatory Visit
